# Patient Record
Sex: Male | Born: 1975 | Race: Black or African American | Hispanic: No | Marital: Married | Smoking: Former smoker
Health system: Southern US, Community
[De-identification: ages and names within clinical notes are randomized; demographics above are authoritative.]

## PROBLEM LIST (undated history)

## (undated) DIAGNOSIS — E7800 Pure hypercholesterolemia, unspecified: Secondary | ICD-10-CM

## (undated) DIAGNOSIS — R55 Syncope and collapse: Secondary | ICD-10-CM

## (undated) DIAGNOSIS — Z8774 Personal history of (corrected) congenital malformations of heart and circulatory system: Secondary | ICD-10-CM

## (undated) DIAGNOSIS — I442 Atrioventricular block, complete: Secondary | ICD-10-CM

## (undated) DIAGNOSIS — Q246 Congenital heart block: Secondary | ICD-10-CM

## (undated) DIAGNOSIS — I441 Atrioventricular block, second degree: Secondary | ICD-10-CM

## (undated) HISTORY — DX: Atrioventricular block, second degree: I44.1

## (undated) HISTORY — DX: Atrioventricular block, complete: I44.2

## (undated) HISTORY — DX: Congenital heart block: Q24.6

## (undated) HISTORY — DX: Syncope and collapse: R55

---

## 2004-10-13 ENCOUNTER — Emergency Department (HOSPITAL_COMMUNITY): Admission: EM | Admit: 2004-10-13 | Discharge: 2004-10-13 | Payer: Self-pay | Admitting: Emergency Medicine

## 2008-05-17 ENCOUNTER — Emergency Department (HOSPITAL_COMMUNITY): Admission: EM | Admit: 2008-05-17 | Discharge: 2008-05-17 | Payer: Self-pay | Admitting: Emergency Medicine

## 2008-09-22 ENCOUNTER — Emergency Department (HOSPITAL_COMMUNITY): Admission: EM | Admit: 2008-09-22 | Discharge: 2008-09-23 | Payer: Self-pay | Admitting: Emergency Medicine

## 2013-10-17 ENCOUNTER — Emergency Department (HOSPITAL_COMMUNITY): Payer: Medicaid Other

## 2013-10-17 ENCOUNTER — Encounter (HOSPITAL_COMMUNITY): Payer: Self-pay | Admitting: Emergency Medicine

## 2013-10-17 ENCOUNTER — Inpatient Hospital Stay (HOSPITAL_COMMUNITY)
Admission: EM | Admit: 2013-10-17 | Discharge: 2013-10-20 | DRG: 243 | Disposition: A | Discharge status: Home / Self Care | Payer: Medicaid Other | Attending: Cardiovascular Disease | Admitting: Cardiovascular Disease

## 2013-10-17 DIAGNOSIS — I442 Atrioventricular block, complete: Secondary | ICD-10-CM | POA: Diagnosis present

## 2013-10-17 DIAGNOSIS — Q246 Congenital heart block: Principal | ICD-10-CM

## 2013-10-17 DIAGNOSIS — R0989 Other specified symptoms and signs involving the circulatory and respiratory systems: Secondary | ICD-10-CM

## 2013-10-17 DIAGNOSIS — I441 Atrioventricular block, second degree: Secondary | ICD-10-CM

## 2013-10-17 DIAGNOSIS — R55 Syncope and collapse: Secondary | ICD-10-CM

## 2013-10-17 DIAGNOSIS — F17200 Nicotine dependence, unspecified, uncomplicated: Secondary | ICD-10-CM | POA: Diagnosis present

## 2013-10-17 HISTORY — DX: Personal history of (corrected) congenital malformations of heart and circulatory system: Z87.74

## 2013-10-17 LAB — CBC WITH DIFFERENTIAL/PLATELET
Basophils Absolute: 0 K/uL (ref 0.0–0.1)
Basophils Relative: 0 % (ref 0–1)
Eosinophils Absolute: 0 10*3/uL (ref 0.0–0.7)
Eosinophils Relative: 1 % (ref 0–5)
HCT: 43.6 % (ref 39.0–52.0)
Hemoglobin: 15.6 g/dL (ref 13.0–17.0)
Lymphocytes Relative: 57 % — ABNORMAL HIGH (ref 12–46)
Lymphs Abs: 3.8 10*3/uL (ref 0.7–4.0)
MCH: 31.1 pg (ref 26.0–34.0)
MCHC: 35.8 g/dL (ref 30.0–36.0)
MCV: 87 fL (ref 78.0–100.0)
Monocytes Absolute: 0.5 10*3/uL (ref 0.1–1.0)
Monocytes Relative: 7 % (ref 3–12)
Neutro Abs: 2.3 10*3/uL (ref 1.7–7.7)
Neutrophils Relative %: 35 % — ABNORMAL LOW (ref 43–77)
Platelets: 210 10*3/uL (ref 150–400)
RBC: 5.01 MIL/uL (ref 4.22–5.81)
RDW: 12.5 % (ref 11.5–15.5)
WBC: 6.7 10*3/uL (ref 4.0–10.5)

## 2013-10-17 NOTE — ED Provider Notes (Signed)
CSN: [id number]     Arrival date & time 10/17/13  2232 History   First MD Initiated Contact with Patient 10/17/13 2256     Chief Complaint  Patient presents with  . Near Syncope   (Consider location/radiation/quality/duration/timing/severity/associated sxs/prior Treatment) HPI Comments: Pt reports no new medications, no drug use.  Priro work up was with pediatric cardiology in [place] years ago.  Patient is a 37 y.o. male presenting with syncope. The history is provided by the patient and a relative.  Loss of Consciousness Episode history:  Single Most recent episode:  Today Duration:  1 minute Timing:  Constant Progression:  Partially resolved Chronicity:  New Context: standing up   Witnessed: yes   Relieved by:  Nothing Worsened by:  Nothing tried Ineffective treatments:  None tried Associated symptoms: chest pain, dizziness and weakness   Associated symptoms: no nausea, no seizures, no shortness of breath and no vomiting   Chest pain:    Quality:  Sharp and pressure   Severity:  Mild   Onset quality:  Sudden   Progression:  Resolved   Chronicity:  New Risk factors: congenital heart disease     Past Medical History  Diagnosis Date  . H/O congenital heart block    History reviewed. No pertinent past surgical history. History reviewed. No pertinent family history. History  Substance Use Topics  . Smoking status: Former Games developer  . Smokeless tobacco: Not on file  . Alcohol Use: Yes    Review of Systems  Respiratory: Positive for chest tightness. Negative for shortness of breath.   Cardiovascular: Positive for chest pain and syncope.  Gastrointestinal: Negative for nausea, vomiting, abdominal pain and diarrhea.  Musculoskeletal: Negative for back pain.  Neurological: Positive for dizziness, syncope and weakness. Negative for seizures.  All other systems reviewed and are negative.    Allergies  Review of patient's allergies indicates no known allergies.  Home  Medications   Current Outpatient Rx  Name  Route  Sig  Dispense  Refill  . ibuprofen (ADVIL,MOTRIN) 800 MG tablet   Oral   Take 800 mg by mouth every 8 (eight) hours as needed for moderate pain.          BP 141/45  Pulse 39  Temp(Src) 97.8 F (36.6 C) (Oral)  Resp 18  SpO2 98% Physical Exam  Nursing note and vitals reviewed. Constitutional: He is oriented to person, place, and time. He appears well-developed and well-nourished. No distress.  Eyes: Conjunctivae and EOM are normal. No scleral icterus.  Neck: Normal range of motion. Neck supple.  Cardiovascular: Intact distal pulses.  An irregular rhythm present. Bradycardia present.   Pulmonary/Chest: Effort normal. No respiratory distress. He has no wheezes.  Abdominal: Soft. There is no tenderness. There is no rebound.  Neurological: He is alert and oriented to person, place, and time. He exhibits normal muscle tone. Coordination normal.  Skin: Skin is warm and dry. He is not diaphoretic.    ED Course  Procedures (including critical care time)  CRITICAL CARE Performed by: Lear Ng. Total critical care time: 30 min Critical care time was exclusive of separately billable procedures and treating other patients. Critical care was necessary to treat or prevent imminent or life-threatening deterioration. Critical care was time spent personally by me on the following activities: development of treatment plan with patient and/or surrogate as well as nursing, discussions with consultants, evaluation of patient's response to treatment, examination of patient, obtaining history from patient or surrogate, ordering and performing treatments  and interventions, ordering and review of laboratory studies, ordering and review of radiographic studies, pulse oximetry and re-evaluation of patient's condition.   Labs Review Labs Reviewed  CBC WITH DIFFERENTIAL  BASIC METABOLIC PANEL  APTT  PROTIME-INR   Imaging Review No results  found.  EKG Interpretation     Ventricular Rate:  36 PR Interval:  479 QRS Duration: 91 QT Interval:  523 QTC Calculation: 405 R Axis:   57 Text Interpretation:  Second degree heart block Prolonged PR interval Right atrial enlargement Abnormal ECG No previous tracing           ra sat is 98% and I interpret to be normal  11:24 PM Pt remains stable with HR variable in the 30-50 range.  Pacer pads nearby.  Labs and [facility] ordered. Discussed with cardiology who agrees to accept to [facility] step down, under observation.    Review of rhythm strips shows second degree block.      MDM   1. Second degree heart block by electrocardiogram (ECG)   2. Syncope      Pt with symptomatic episodes of bradycardia, ECG suggests buried P wave indicating 2nd degree heart block, unable to tell if type 1 or type 2.  Will likely need a pacemaker.  Will consult with cardiology and likely transfer to [facility] ED.      Gavin Pound. Oletta Lamas, MD 10/17/13 2332

## 2013-10-17 NOTE — ED Notes (Signed)
Pt arrived to Ed with a complaint of near syncope.  Pt has a hx of congential heart defect.  Pt is bradycardic in triage.  Pt states his head hurts.  Pt dyed his hair and then felt faint.  Pt states at that time he had chest pain.

## 2013-10-17 NOTE — ED Notes (Signed)
Pt placed on Zoll monitor per Dr Harmon Pier request; pacer pads placed per Dr Oletta Lamas.

## 2013-10-18 ENCOUNTER — Encounter (HOSPITAL_COMMUNITY): Payer: Self-pay | Admitting: Internal Medicine

## 2013-10-18 DIAGNOSIS — R55 Syncope and collapse: Secondary | ICD-10-CM

## 2013-10-18 DIAGNOSIS — I059 Rheumatic mitral valve disease, unspecified: Secondary | ICD-10-CM

## 2013-10-18 DIAGNOSIS — I441 Atrioventricular block, second degree: Secondary | ICD-10-CM

## 2013-10-18 DIAGNOSIS — Q246 Congenital heart block: Principal | ICD-10-CM

## 2013-10-18 DIAGNOSIS — I442 Atrioventricular block, complete: Secondary | ICD-10-CM | POA: Diagnosis present

## 2013-10-18 DIAGNOSIS — J811 Chronic pulmonary edema: Secondary | ICD-10-CM

## 2013-10-18 LAB — COMPREHENSIVE METABOLIC PANEL
ALT: 23 U/L (ref 0–53)
Albumin: 3.9 g/dL (ref 3.5–5.2)
BUN: 9 mg/dL (ref 6–23)
CO2: 25 mEq/L (ref 19–32)
Calcium: 9.2 mg/dL (ref 8.4–10.5)
Creatinine, Ser: 0.8 mg/dL (ref 0.50–1.35)
GFR calc Af Amer: >90 mL/min (ref >90)
GFR calc non Af Amer: >90 mL/min (ref >90)
Glucose, Bld: 121 mg/dL — ABNORMAL HIGH (ref 70–99)
Sodium: 138 mEq/L (ref 135–145)
Total Protein: 7.5 g/dL (ref 6.0–8.3)

## 2013-10-18 LAB — APTT
aPTT: 33 seconds (ref 24–37)
aPTT: 38 seconds — ABNORMAL HIGH (ref 24–37)

## 2013-10-18 LAB — TSH: TSH: 0.356 u[IU]/mL (ref 0.350–4.500)

## 2013-10-18 LAB — BASIC METABOLIC PANEL
BUN: 11 mg/dL (ref 6–23)
CO2: 30 mEq/L (ref 19–32)
Calcium: 9.8 mg/dL (ref 8.4–10.5)
Chloride: 99 mEq/L (ref 96–112)
Creatinine, Ser: 0.86 mg/dL (ref 0.50–1.35)
GFR calc Af Amer: >90 mL/min (ref >90)
GFR calc non Af Amer: >90 mL/min (ref >90)
Glucose, Bld: 105 mg/dL — ABNORMAL HIGH (ref 70–99)
Potassium: 3.7 mEq/L (ref 3.5–5.1)
Sodium: 137 mEq/L (ref 135–145)

## 2013-10-18 LAB — PROTIME-INR
INR: 1.04 (ref 0.00–1.49)
INR: 1.06 (ref 0.00–1.49)
Prothrombin Time: 13.4 seconds (ref 11.6–15.2)
Prothrombin Time: 13.6 seconds (ref 11.6–15.2)

## 2013-10-18 MED ORDER — ACETAMINOPHEN 325 MG PO TABS
650.0000 mg | ORAL_TABLET | Freq: Four times a day (QID) | ORAL | Status: DC | PRN
  Administered 2013-10-18 – 2013-10-20 (×3): 650 mg via ORAL
  Filled 2013-10-18 (×3): qty 2

## 2013-10-18 MED ORDER — SODIUM CHLORIDE 0.9 % IV SOLN
INTRAVENOUS | Status: AC
  Administered 2013-10-18: 03:00:00 via INTRAVENOUS

## 2013-10-18 MED ORDER — ACETAMINOPHEN 650 MG RE SUPP: 650.0000 mg | Freq: Four times a day (QID) | RECTAL | Status: DC | PRN

## 2013-10-18 MED ORDER — SODIUM CHLORIDE 0.9 % IJ SOLN
3.0000 mL | Freq: Two times a day (BID) | INTRAMUSCULAR | Status: DC
  Administered 2013-10-18 – 2013-10-19 (×4): 3 mL via INTRAVENOUS

## 2013-10-18 NOTE — Progress Notes (Signed)
    Subjective:  Pt presented overnight after a syncopal episode while dying his hair and was found to be in 2nd degree AV block, type II. He denies any chest pain, SOB, abdominal pain, N/V, and states that he feels like his "normal self."   Objective:  Vital Signs in the last 24 hours: Temp:  [97.7 F (36.5 C)-98.5 F (36.9 C)] 98.5 F (36.9 C) (11/09 0443) Pulse Rate:  [32-39] 32 (11/09 0800) Resp:  [13-22] 15 (11/09 0800) BP: (104-141)/(33-62) 112/43 mmHg (11/09 0800) SpO2:  [98 %-100 %] 98 % (11/09 0800) Weight:  [194 lb 14.2 oz (88.4 kg)] 194 lb 14.2 oz (88.4 kg) (11/09 0220)  Intake/Output from previous day: 11/08 0701 - 11/09 0700 In: 262.5 [I.V.:262.5] Out: -   Physical Exam: Pt is very pleasant male, alert and oriented, NAD HEENT: NCAT Neck: JVP - normal, carotids 2+= without bruits Lungs: CTA bilaterally CV: Regular without murmur or gallop Abd: soft, NT, Positive BS, no hepatomegaly Ext: no C/C/E, distal pulses intact and equal Neuro: CN 2-12 intact, nonfocal, moves all 4 extremities, sensation intact Skin: warm/dry no rash   Lab Results:  Recent Labs  10/17/13 2310  WBC 6.7  HGB 15.6  PLT 210    Recent Labs  10/17/13 2310 10/18/13 0510  NA 137 138  K 3.7 3.8  CL 99 102  CO2 30 25  GLUCOSE 105* 121*  BUN 11 9  CREATININE 0.86 0.80   No results found for this basename: TROPONINI, CK, MB,  in the last 72 hours  Cardiac Studies: 2D ECHO pending  Tele: 2:1 AV block, Type II, rate 60.  Assessment/Plan:  37yo M with PMH of congenital heart block who presented tot he ED early in the morning of 11/9 with syncope, and in the ED telemetry revealed HR 30-80s with a 2:1 AV Block/Mobitz II.  1. 2:1 AV block/Mobitz II: Pt with a h/o of congenital heart block. He denise any h/o of CP or previous syncopal episodes, but given his medial history, the AV block could be the patient's baseline rhythm, and the strong smell from dying his hair could have  triggered the syncope. CMP, CBC, and pro BNP all within normal limits. Troponins have been ordered and TSH is pending. Will check Mg levels as well. ECHO has been ordered for today to evaluate for structural disease.    Genelle Gather, MD Internal Medicine Resident, PGY II [facility] Internal Medicine Program 10/18/2013 10:15 AM

## 2013-10-18 NOTE — Progress Notes (Signed)
The patient was seen and examined, and I agree with the assessment and plan as documented above, with modifications as noted below. 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 Echocardiogram is about to be performed. I have encouraged him to obtain routine cardiac follow-up, and he has agreed to this. He had some mild pulmonary vascular congestion on exam, but his lungs are clear to auscultation at present and he is asymptomatic, with a normal pro-BNP. Will f/u echo results. I agree that consideration of a treadmill test to assess chronotropic response, and to see if he can walk around in the room or hall with assistance may be the best course of action, hopefully avoiding pacemaker placement, as his syncopal event appears to have been driven by a vasovagal response from the noxious scent of the hair dye.

## 2013-10-18 NOTE — Progress Notes (Signed)
Utilization Review completed.  

## 2013-10-18 NOTE — H&P (Signed)
RAINEY KAHRS is an 37 y.o. male.   Chief Complaint: Syncope HPI: 37 yo man with PMH of congenital heart block who comes in with syncope. He tells me he hasn't seen a pediatric cardiologist since he was 16 or 17. He believes he followed at [facility]. He's lived in [place] since age 86. 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 He tells me he is always on the go. He's never passed out before and never had chest pain. No recent illness. He was dying some gray hairs at the sink and remembers a very potent, powerful smell and the next thing he knows he was on the floor. This was witnessed by his girlfriend who is returning to [facility] but not there on my history and physical. He said he did not have forewarning otherwise but did also note some nausea briefly. In the ER his EKG was notable for HR 30s-80s with 2:1 AV block intermittently and Cardiology was consulted.    Past Medical History  Diagnosis Date  . H/O congenital heart block     History reviewed. No pertinent past surgical history.  History reviewed. No pertinent family history. Social History:  reports that he has quit smoking. He does not have any smokeless tobacco history on file. He reports that he drinks alcohol. He reports that he does not use illicit drugs. No family history of CAD or heart block  Allergies: No Known Allergies   (Not in a hospital admission)  Results for orders placed during the hospital encounter of 10/17/13 (from the past 48 hour(s))  CBC WITH DIFFERENTIAL     Status: Abnormal   Collection Time    10/17/13 11:10 PM      Result Value Range   WBC 6.7  4.0 - 10.5 K/uL   RBC 5.01  4.22 - 5.81 MIL/uL   Hemoglobin 15.6  13.0 - 17.0 g/dL   HCT 96.0  45.4 - 09.8 %   MCV 87.0  78.0 - 100.0 fL   MCH 31.1  26.0 - 34.0 pg   MCHC 35.8  30.0 - 36.0 g/dL   RDW 11.9  14.7 - 82.9 %   Platelets 210  150 - 400 K/uL   Neutrophils Relative % 35 (*) 43 - 77 %   Neutro Abs 2.3  1.7  - 7.7 K/uL   Lymphocytes Relative 57 (*) 12 - 46 %   Lymphs Abs 3.8  0.7 - 4.0 K/uL   Monocytes Relative 7  3 - 12 %   Monocytes Absolute 0.5  0.1 - 1.0 K/uL   Eosinophils Relative 1  0 - 5 %   Eosinophils Absolute 0.0  0.0 - 0.7 K/uL   Basophils Relative 0  0 - 1 %   Basophils Absolute 0.0  0.0 - 0.1 K/uL  BASIC METABOLIC PANEL     Status: Abnormal   Collection Time    10/17/13 11:10 PM      Result Value Range   Sodium 137  135 - 145 mEq/L   Potassium 3.7  3.5 - 5.1 mEq/L   Chloride 99  96 - 112 mEq/L   CO2 30  19 - 32 mEq/L   Glucose, Bld 105 (*) 70 - 99 mg/dL   BUN 11  6 - 23 mg/dL   Creatinine, Ser 5.62  0.50 - 1.35 mg/dL   Calcium 9.8  8.4 - 13.0 mg/dL   GFR calc non Af Amer >90  >90 mL/min  GFR calc Af Amer >90  >90 mL/min   Comment: (NOTE)     The eGFR has been calculated using the CKD EPI equation.     This calculation has not been validated in all clinical situations.     eGFR's persistently <90 mL/min signify possible Chronic Kidney     Disease.  APTT     Status: None   Collection Time    10/17/13 11:10 PM      Result Value Range   aPTT 33  24 - 37 seconds  PROTIME-INR     Status: None   Collection Time    10/17/13 11:10 PM      Result Value Range   Prothrombin Time 13.4  11.6 - 15.2 seconds   INR 1.04  0.00 - 1.49   Dg Chest Port 1 View  10/17/2013   CLINICAL DATA:  Chest pain  EXAM: PORTABLE CHEST - 1 VIEW  COMPARISON:  None available  FINDINGS: Defibrillator pads overlie the left hemi thorax. Transverse heart size is within normal limits.  Lungs are normally inflated. There is mild indistinctness of the pulmonary vasculature diffusely without pulmonary edema. No focal infiltrate. No pneumothorax.  Osseous structures are within normal limits.  IMPRESSION: Mild pulmonary vascular congestion without overt pulmonary edema or focal infiltrate.   Electronically Signed   By: Rise Mu M.D.   On: 10/17/2013 23:34    Review of Systems  Constitutional:  Negative for fever, chills and weight loss.  HENT: Negative for hearing loss and tinnitus.   Eyes: Negative for blurred vision, double vision and photophobia.  Respiratory: Negative for cough, hemoptysis and sputum production.   Cardiovascular: Negative for chest pain, palpitations and orthopnea.  Gastrointestinal: Negative for heartburn, nausea and vomiting.  Genitourinary: Negative for dysuria, urgency and frequency.  Musculoskeletal: Negative for myalgias and neck pain.  Skin: Negative for itching and rash.  Neurological: Positive for loss of consciousness. Negative for dizziness, tingling, tremors and headaches.  Endo/Heme/Allergies: Negative for environmental allergies. Does not bruise/bleed easily.  Psychiatric/Behavioral: Negative for depression and suicidal ideas.    Blood pressure 141/45, pulse 39, temperature 97.8 F (36.6 C), temperature source Oral, resp. rate 18, SpO2 98.00%. Physical Exam  Nursing note and vitals reviewed. Constitutional: He is oriented to person, place, and time. He appears well-developed and well-nourished. No distress.  HENT:  Head: Normocephalic and atraumatic.  Nose: Nose normal.  Mouth/Throat: Oropharynx is clear and moist. No oropharyngeal exudate.  Eyes: Conjunctivae and EOM are normal. Pupils are equal, round, and reactive to light. No scleral icterus.  Neck: Normal range of motion. Neck supple. No JVD present. No tracheal deviation present. No thyromegaly present.  Cardiovascular: Normal heart sounds and intact distal pulses.  Exam reveals no gallop.   No murmur heard. Fairly regular but some irregularity  Respiratory: Effort normal and breath sounds normal. No respiratory distress. He has no wheezes. He has no rales.  GI: Soft. Bowel sounds are normal. He exhibits no distension. There is no tenderness.  Musculoskeletal: Normal range of motion. He exhibits no edema and no tenderness.  Neurological: He is alert and oriented to person, place, and  time. No cranial nerve deficit. Coordination normal.  Skin: Skin is warm and dry. No rash noted. He is not diaphoretic. No erythema.  Psychiatric: He has a normal mood and affect. His behavior is normal. Judgment and thought content normal.    Labs reviewed; wbc 6.7, h/h 15.6/43.6, plt 210, na 137, K 3.7, bun/cr 11/0.86, cl 99,  co2 30 Chest x-ray: no acute process EKG: HR 36, nl axis, RAE, 2:1 AV Block  Problem List Syncope 2:1 AV Block (Mobitz Type II) Congenital Heart Block Occasional Black and Mild tobacco Assessment/Plan 36 yo man with PMH of congenital heart block who comes in with syncope and found to have 2:1 AV Block/Mobitz II. In the setting of syncope in a patient without a history of congenital heat block, this warrants a PPM unless other driving event. However, this may be his underlying rhythm he's tolerated well with appropriate chronotropic response and this syncopal episode was triggered by the smell of the hair dye. Will seek out other etiologies, evaluate for ischemia, electrolytes, thyroid disease, other structural heart disease among other etiologies while on stepdown/telemetry. Will try to obtain records in AM but may be past window (15-16 years ago or more). Consideration of treadmill test to assess chronotropic response if he can walk around in room or hall with assistance may be best course of action. Will also obtain echo to evaluate for any structural disease. I discussed my thoughts with Mr. Boehlke who understood and was agreeable.  - admit to stepdown with telemetry - BNP, Mg, TSH - No AV nodal blocking agents - echocardiogram in AM - smoking cessation counseling but he's already planning on quitting as he smokes infrequently now  Shmiel Morton 10/18/2013, 12:11 AM

## 2013-10-18 NOTE — Progress Notes (Signed)
  Echocardiogram 2D Echocardiogram has been performed.  Jonathan Logan 10/18/2013, 11:12 AM

## 2013-10-19 ENCOUNTER — Encounter (HOSPITAL_COMMUNITY)
Admission: EM | Disposition: A | Discharge status: Home / Self Care | Payer: Self-pay | Source: Home / Self Care | Attending: Cardiovascular Disease

## 2013-10-19 ENCOUNTER — Inpatient Hospital Stay (HOSPITAL_COMMUNITY): Payer: Medicaid Other

## 2013-10-19 DIAGNOSIS — I442 Atrioventricular block, complete: Secondary | ICD-10-CM

## 2013-10-19 HISTORY — PX: PERMANENT PACEMAKER INSERTION: SHX5480

## 2013-10-19 SURGERY — PERMANENT PACEMAKER INSERTION
Anesthesia: LOCAL

## 2013-10-19 MED ORDER — LIDOCAINE HCL (PF) 1 % IJ SOLN
INTRAMUSCULAR | Status: AC
  Filled 2013-10-19: qty 60

## 2013-10-19 MED ORDER — FENTANYL CITRATE 0.05 MG/ML IJ SOLN
INTRAMUSCULAR | Status: AC
  Filled 2013-10-19: qty 2

## 2013-10-19 MED ORDER — CEFAZOLIN SODIUM-DEXTROSE 2-3 GM-% IV SOLR
2.0000 g | Freq: Four times a day (QID) | INTRAVENOUS | Status: AC
  Administered 2013-10-19 – 2013-10-20 (×3): 2 g via INTRAVENOUS
  Filled 2013-10-19 (×3): qty 50

## 2013-10-19 MED ORDER — ACETAMINOPHEN 325 MG PO TABS: 325.0000 mg | ORAL_TABLET | ORAL | Status: DC | PRN

## 2013-10-19 MED ORDER — SODIUM CHLORIDE 0.9 % IV SOLN: 250.0000 mL | INTRAVENOUS | Status: DC

## 2013-10-19 MED ORDER — SODIUM CHLORIDE 0.9 % IR SOLN
80.0000 mg | Status: DC
  Filled 2013-10-19: qty 2

## 2013-10-19 MED ORDER — HEPARIN (PORCINE) IN NACL 2-0.9 UNIT/ML-% IJ SOLN
INTRAMUSCULAR | Status: AC
  Filled 2013-10-19: qty 500

## 2013-10-19 MED ORDER — YOU HAVE A PACEMAKER BOOK
Freq: Once | Status: AC
  Administered 2013-10-19: 22:00:00
  Filled 2013-10-19: qty 1

## 2013-10-19 MED ORDER — CEFAZOLIN SODIUM-DEXTROSE 2-3 GM-% IV SOLR
2.0000 g | INTRAVENOUS | Status: DC
  Filled 2013-10-19 (×3): qty 50

## 2013-10-19 MED ORDER — SODIUM CHLORIDE 0.9 % IJ SOLN
3.0000 mL | Freq: Two times a day (BID) | INTRAMUSCULAR | Status: DC
  Administered 2013-10-19: 3 mL via INTRAVENOUS

## 2013-10-19 MED ORDER — SODIUM CHLORIDE 0.9 % IJ SOLN: 3.0000 mL | INTRAMUSCULAR | Status: DC | PRN

## 2013-10-19 MED ORDER — HYDROCODONE-ACETAMINOPHEN 5-325 MG PO TABS
1.0000 | ORAL_TABLET | ORAL | Status: DC | PRN
  Administered 2013-10-19 – 2013-10-20 (×3): 1 via ORAL
  Filled 2013-10-19 (×3): qty 1

## 2013-10-19 MED ORDER — MIDAZOLAM HCL 5 MG/5ML IJ SOLN
INTRAMUSCULAR | Status: AC
  Filled 2013-10-19: qty 5

## 2013-10-19 MED ORDER — SODIUM CHLORIDE 0.9 % IV SOLN
INTRAVENOUS | Status: DC
  Administered 2013-10-19: 1000 mL via INTRAVENOUS

## 2013-10-19 MED ORDER — ONDANSETRON HCL 4 MG/2ML IJ SOLN: 4.0000 mg | Freq: Four times a day (QID) | INTRAMUSCULAR | Status: DC | PRN

## 2013-10-19 NOTE — Consult Note (Signed)
  Reason for Consult:CHB and syncope  Referring Physician: Dr. [name] is an 37 y.o. male.   HPI: The patient is a 37 yo man with congenital CHB, diagnosed at age 37. He has done well until presenting with syncope after smelling noxious chemicals while he was getting his hair dyed. He was taken to the hospital where he was found to have CHB with a junctional escape/alternating with a ventricular escape. He has never had syncope before and denies chest pain or sob. No edema or CHF symptoms.  PMH: Past Medical History  Diagnosis Date  . H/O congenital heart block     PSHX:History reviewed. No pertinent past surgical history.  FAMHX:History reviewed. No pertinent family history.  Social History:  reports that he has quit smoking. He does not have any smokeless tobacco history on file. He reports that he drinks alcohol. He reports that he does not use illicit drugs.  Allergies: No Known Allergies  Medications: reviewed  Dg Chest 2 View  10/19/2013   CLINICAL DATA:  Chest pain  EXAM: CHEST  2 VIEW  COMPARISON:  10/17/2013  FINDINGS: The heart size and mediastinal contours are within normal limits. Both lungs are clear. The visualized skeletal structures are unremarkable.  IMPRESSION: No active cardiopulmonary disease.   Electronically Signed   By: Jonathan Logan M.D.   On: 10/19/2013 10:03   Dg Chest Port 1 View  10/17/2013   CLINICAL DATA:  Chest pain  EXAM: PORTABLE CHEST - 1 VIEW  COMPARISON:  None available  FINDINGS: Defibrillator pads overlie the left hemi thorax. Transverse heart size is within normal limits.  Lungs are normally inflated. There is mild indistinctness of the pulmonary vasculature diffusely without pulmonary edema. No focal infiltrate. No pneumothorax.  Osseous structures are within normal limits.  IMPRESSION: Mild pulmonary vascular congestion without overt pulmonary edema or focal infiltrate.   Electronically Signed   By: Jonathan Logan M.D.    On: 10/17/2013 23:34    ROS  As stated in the HPI and negative for all other systems.  Physical Exam  Vitals:Blood pressure 148/79, pulse 40, temperature 98.8 F (37.1 C), temperature source Oral, resp. rate 14, height 5\' 8"  (1.727 m), weight 194 lb 14.2 oz (88.4 kg), SpO2 98.00%.  Well appearing young man, NAD HEENT: Unremarkable Neck:  No JVD, no thyromegally Back:  No CVA tenderness Lungs:  Clear with no wheezes HEART:  Regular brady rhythm, no murmurs, no rubs, no clicks Abd:  soft, positive bowel sounds, no organomegally, no rebound, no guarding Ext:  2 plus pulses, no edema, no cyanosis, no clubbing Skin:  No rashes no nodules Neuro:  CN II through XII intact, motor grossly intact  Assessment/Plan: 1. CHB 2. Syncope Rec: I have discussed the situation with the patient and his family. The risks/benefits/goals/expectations of PPM have been discussed with the patient and he wishes to proceed.  Jonathan Gowda TaylorMD 10/19/2013, 1:22 PM

## 2013-10-19 NOTE — Plan of Care (Signed)
Problem: Phase II Progression Outcomes Goal: Progress activity as tolerated unless otherwise ordered Outcome: Completed/Met Date Met:  10/19/13 Pt ambulated in hall during am

## 2013-10-19 NOTE — H&P (View-Only) (Signed)
  Reason for Consult:CHB and syncope  Referring Physician: Dr. Koneswaren  Jonathan Logan is an 37 y.o. male.   HPI: The patient is a 37 yo man with congenital CHB, diagnosed at age 6. He has done well until presenting with syncope after smelling noxious chemicals while he was getting his hair dyed. He was taken to the hospital where he was found to have CHB with a junctional escape/alternating with a ventricular escape. He has never had syncope before and denies chest pain or sob. No edema or CHF symptoms.  PMH: Past Medical History  Diagnosis Date  . H/O congenital heart block     PSHX:History reviewed. No pertinent past surgical history.  FAMHX:History reviewed. No pertinent family history.  Social History:  reports that he has quit smoking. He does not have any smokeless tobacco history on file. He reports that he drinks alcohol. He reports that he does not use illicit drugs.  Allergies: No Known Allergies  Medications: reviewed  Dg Chest 2 View  10/19/2013   CLINICAL DATA:  Chest pain  EXAM: CHEST  2 VIEW  COMPARISON:  10/17/2013  FINDINGS: The heart size and mediastinal contours are within normal limits. Both lungs are clear. The visualized skeletal structures are unremarkable.  IMPRESSION: No active cardiopulmonary disease.   Electronically Signed   By: David  Ormond M.D.   On: 10/19/2013 10:03   Dg Chest Port 1 View  10/17/2013   CLINICAL DATA:  Chest pain  EXAM: PORTABLE CHEST - 1 VIEW  COMPARISON:  None available  FINDINGS: Defibrillator pads overlie the left hemi thorax. Transverse heart size is within normal limits.  Lungs are normally inflated. There is mild indistinctness of the pulmonary vasculature diffusely without pulmonary edema. No focal infiltrate. No pneumothorax.  Osseous structures are within normal limits.  IMPRESSION: Mild pulmonary vascular congestion without overt pulmonary edema or focal infiltrate.   Electronically Signed   By: Benjamin  McClintock M.D.    On: 10/17/2013 23:34    ROS  As stated in the HPI and negative for all other systems.  Physical Exam  Vitals:Blood pressure 148/79, pulse 40, temperature 98.8 F (37.1 C), temperature source Oral, resp. rate 14, height 5' 8" (1.727 m), weight 194 lb 14.2 oz (88.4 kg), SpO2 98.00%.  Well appearing young man, NAD HEENT: Unremarkable Neck:  No JVD, no thyromegally Back:  No CVA tenderness Lungs:  Clear with no wheezes HEART:  Regular brady rhythm, no murmurs, no rubs, no clicks Abd:  soft, positive bowel sounds, no organomegally, no rebound, no guarding Ext:  2 plus pulses, no edema, no cyanosis, no clubbing Skin:  No rashes no nodules Neuro:  CN II through XII intact, motor grossly intact  Assessment/Plan: 1. CHB 2. Syncope Rec: I have discussed the situation with the patient and his family. The risks/benefits/goals/expectations of PPM have been discussed with the patient and he wishes to proceed.  Gregg TaylorMD 10/19/2013, 1:22 PM      

## 2013-10-19 NOTE — Progress Notes (Signed)
Pt was ambulated on unit per order and was found to have HR of 45-49 while ambulating, compared to HR of 39 while resting.  Per monitor EKG, pt was found to have slight ST elevation, strips have been saved and MD has been made aware.  Nurse will continue to monitor

## 2013-10-19 NOTE — Progress Notes (Signed)
Pt viewed pacemaker video

## 2013-10-19 NOTE — CV Procedure (Signed)
DDD PPM insertion via the left subclavian vein without immediate complication. [id number].

## 2013-10-19 NOTE — Progress Notes (Addendum)
Patient Name: KAZUO DURNIL Date of Encounter: 10/19/2013  Principal Problem:   Syncope Active Problems:   Mobitz type 2 second degree AV block   Congenital heart block    SUBJECTIVE: Pt feels fine, HR increases with minimal activity. He does not routinely experience presyncope. Does not remember being told of a heart murmur as a child, was always seen for heart rhythm problem.  OBJECTIVE Filed Vitals:   10/19/13 0030 10/19/13 0100 10/19/13 0400 10/19/13 0500  BP:  101/68  94/60  Pulse: 80  97   Temp: 97.8 F (36.6 C)  97.7 F (36.5 C)   TempSrc: Oral  Oral   Resp: 10  23 17   Height:      Weight:      SpO2: 100%   98%    Intake/Output Summary (Last 24 hours) at 10/19/13 0641 Last data filed at 10/18/13 2220  Gross per 24 hour  Intake    300 ml  Output    350 ml  Net    -50 ml   Filed Weights   10/18/13 0220  Weight: 194 lb 14.2 oz (88.4 kg)    PHYSICAL EXAM General: Well developed, well nourished, male in no acute distress. Head: Normocephalic, atraumatic.  Neck: Supple without bruits, JVD not elevated. Lungs:  Resp regular and unlabored, CTA bilaterally. Heart: RRR, S1, S2, no S3, S4, 2-3 murmur; no rub. Abdomen: Soft, non-tender, non-distended, BS + x 4.  Extremities: No clubbing, cyanosis, no edema.  Neuro: Alert and oriented X 3. Moves all extremities spontaneously. Psych: Normal affect.  LABS: CBC: Recent Labs  10/17/13 2310  WBC 6.7  NEUTROABS 2.3  HGB 15.6  HCT 43.6  MCV 87.0  PLT 210   INR: Recent Labs  10/18/13 0510  INR 1.06   Basic Metabolic Panel: Recent Labs  10/17/13 2310 10/18/13 0510  NA 137 138  K 3.7 3.8  CL 99 102  CO2 30 25  GLUCOSE 105* 121*  BUN 11 9  CREATININE 0.86 0.80  CALCIUM 9.8 9.2  MG  --  1.9   Liver Function Tests: Recent Labs  10/18/13 0510  AST 20  ALT 23  ALKPHOS 83  BILITOT 0.4  PROT 7.5  ALBUMIN 3.9   Cardiac Enzymes: Recent Labs  10/19/13 0340  TROPONINI <0.30    BNP: Pro B Natriuretic peptide (BNP)  Date/Time Value Range Status  10/18/2013  5:10 AM 19.2  0 - 125 pg/mL Final   Thyroid Function Tests: Recent Labs  10/18/13 0510  TSH 0.356   TELE:   CHB, HR low at times, without symptoms    ECG: MD review, believe he is in CHB, HR generally OK.   ECHO: - 10/18/2013 Conclusions:  Left ventricle: Wall thickness was increased in a pattern of mild LVH. Systolic function was normal. The estimated ejection fraction was in the range of 60% to 65%. Left ventricular diastolic function parameters were normal. - Mitral valve: Mild regurgitation.   Radiology/Studies: Dg Chest Port 1 View 10/17/2013   CLINICAL DATA:  Chest pain  EXAM: PORTABLE CHEST - 1 VIEW  COMPARISON:  None available  FINDINGS: Defibrillator pads overlie the left hemi thorax. Transverse heart size is within normal limits.  Lungs are normally inflated. There is mild indistinctness of the pulmonary vasculature diffusely without pulmonary edema. No focal infiltrate. No pneumothorax.  Osseous structures are within normal limits.  IMPRESSION: Mild pulmonary vascular congestion without overt pulmonary edema or focal infiltrate.   Electronically  Signed   By: Rise Mu M.D.   On: 10/17/2013 23:34     Current Medications:  . sodium chloride  3 mL Intravenous Q12H      ASSESSMENT AND PLAN: Principal Problem:   Syncope - telemetry reviewed. Believe he is in CHB. Per pt, this is chronic. Will ck free T4 since TSH borderline. MD review all data, will order ambulation and have staff document HR with ambulation. EF normal. Reviewed data w/ Dr. Ladona Ridgel, will need PPM, have put on for this pm.  Active Problems: 1. Syncope  2. Congenital complete heart block - see above.   Melida Quitter , PA-C 6:41 AM 10/19/2013  EP Attending  Patient seen and examined. See my consult note.  [name].D.

## 2013-10-19 NOTE — Interval H&P Note (Signed)
History and Physical Interval Note:  10/19/2013 2:58 PM  Jonathan Logan  has presented today for surgery, with the diagnosis of bradicardia  The various methods of treatment have been discussed with the patient and family. After consideration of risks, benefits and other options for treatment, the patient has consented to  Procedure(s): PERMANENT PACEMAKER INSERTION (N/A) as a surgical intervention .  The patient's history has been reviewed, patient examined, no change in status, stable for surgery.  I have reviewed the patient's chart and labs.  Questions were answered to the patient's satisfaction.     [name].D.

## 2013-10-20 ENCOUNTER — Inpatient Hospital Stay (HOSPITAL_COMMUNITY): Payer: Medicaid Other

## 2013-10-20 DIAGNOSIS — I441 Atrioventricular block, second degree: Secondary | ICD-10-CM

## 2013-10-20 NOTE — Discharge Summary (Signed)
     ELECTROPHYSIOLOGY PROCEDURE DISCHARGE SUMMARY    Patient ID: Jonathan Logan,  MRN: [id number], DOB/AGE: 1976-09-29 37 y.o.  Admit date: 10/17/2013 Discharge date: 10/20/2013  Primary Care Physician: none Primary Cardiologist: new to [facility] - will follow with Dr Ladona Ridgel  Primary Discharge Diagnosis:  Congenital complete heart block and syncope status post dual chamber pacemaker implantation this admission    Procedures This Admission:  1.  Implantation of a dual chamber pacemaker by Dr Ladona Ridgel on 10-20-2013.  The patient received a Medtronic pacemaker.  See op note for full details.  There were no early apparent complications. 2.  Echo on 10-18-2013 demonstrated an EF of 60-65% with mild MR 3.  CXR on 10-20-2013 demonstrated no pneumothorax status post device implantation  Brief HPI/Hospital Course: Jonathan Logan is a 37 year old male who was diagnosed with congenital complete heart block at age 69.  He has done well without functional limitations.  On the day of admission, he had a syncopal spell and presented to the ER for evaluation.  He was seen by Dr Ladona Ridgel who recommended pacemaker implantation.  Risks, benefits, and alternatives were reviewed with the patient who wished to proceed. The patient underwent implantation of a Medtronic dual chamber pacemaker with details as outlined above.   He was monitored on telemetry overnight which demonstrated sinus rhythm with ventricular pacing.  Left chest was without hematoma or ecchymosis.  The device was interrogated and found to be functioning normally.  CXR was obtained and demonstrated no pneumothorax status post device implantation.  Wound care, arm mobility, and restrictions were reviewed with the patient.  Dr Ladona Ridgel examined the patient and considered them stable for discharge to home.  He has been encouraged to follow up with a primary care physician. Routine follow-up has been scheduled with Dr Ladona Ridgel.    Discharge Vitals:  Blood pressure 141/81, pulse 73, temperature 98.1 F (36.7 C), temperature source Oral, resp. rate 20, height 5\' 8"  (1.727 m), weight 196 lb 3.4 oz (89 kg), SpO2 96.00%.    Labs:   Lab Results  Component Value Date   WBC 6.7 10/17/2013   HGB 15.6 10/17/2013   HCT 43.6 10/17/2013   MCV 87.0 10/17/2013   PLT 210 10/17/2013    Recent Labs Lab 10/18/13 0510  NA 138  K 3.8  CL 102  CO2 25  BUN 9  CREATININE 0.80  CALCIUM 9.2  PROT 7.5  BILITOT 0.4  ALKPHOS 83  ALT 23  AST 20  GLUCOSE 121*   Lab Results  Component Value Date   TROPONINI <0.30 10/19/2013     Discharge Medications:    Medication List    ASK your doctor about these medications       ibuprofen 800 MG tablet  Commonly known as:  ADVIL,MOTRIN  Take 800 mg by mouth every 8 (eight) hours as needed for moderate pain.        Disposition:       Future Appointments Provider Department Dept Phone   11/02/2013 12:00 PM [facility] [place] Office [phone]   01/22/2014 10:00 AM Marinus Maw, MD [facility] Office [phone]      Duration of Discharge Encounter: Greater than 30 minutes including physician time.  Signed, Gypsy Balsam, RN, BSN 10/20/2013, 8:35 AM  EP Attending  Patient seen and examined. Agree with above.  [name].D.

## 2013-10-20 NOTE — Op Note (Signed)
Jonathan Logan, Jonathan NO.:  [id number]  MEDICAL RECORD NO.:  [id number]  LOCATION:  6C08C                        FACILITY:  [facility]  PHYSICIAN:  Doylene Canning. Ladona Ridgel, MD    DATE OF BIRTH:  [birth year]  DATE OF PROCEDURE:  10/19/2013 DATE OF DISCHARGE:  10/20/2013                              OPERATIVE REPORT   PROCEDURE PERFORMED:  Insertion of a dual-chamber pacemaker.  INDICATION:  Congenital complete heart block with syncope.  INTRODUCTION:  The patient is a very pleasant 37 year old man who is admitted to the hospital after syncopal episode which occurred while he was coloring his beard.  He thought it was related to the fumes.  He was subsequently found to have complete heart block with a very slow ventricular rate in the high 20s and low 30s, although the rate would increased up into the 40s at times with persistence of complete heart block.  He is now referred for insertion of a dual-chamber pacemaker. He has normal left ventricular function.  DESCRIPTION OF PROCEDURE:  After informed consent was obtained, the patient was taken to the diagnostic EP lab in a fasting state.  After usual preparation and draping, intravenous fentanyl and midazolam was given for sedation.  A 30 mL of lidocaine was infiltrated into the left infraclavicular region.  A 5-cm incision was carried out over this region and electrocautery was utilized to dissect down the fascial plane.  The left cephalic vein was dissected free, but was found to be unusually small.  A guidewire could not be advanced into the vein and it was abandoned for IV access.  At this point, the subclavian vein was punctured and the Medtronic model 5076, 58 cm active fixation pacing lead, serial #[id number] was advanced to the right ventricle and the Medtronic model 5076, 52 cm active fixation pacing lead was advanced to the right atrium.  Mapping was first carried out in the right ventricle. At the final site, the  R-waves were 10, the pacing impedance was around 1200 ohms and threshold was initially 2 V at 0.5 milliseconds.  It should be noted that the only tried pacing once out of concerns of creating worsening heart block with 10 V paced.  There was a large injury current.  It should be noted that following PVCs during the case, the patient would have prolonged episodes of asystole typically for over 5 seconds.  At this point, attention was then turned to the atrial lead, was placed in anterolateral portion of the right atrium where P-waves measured 4 mV and with the lead actively fixed, the pacing impedance was 700 ohms and a threshold 0.5 V at 0.5 milliseconds.  Again, 10 V pacing did not stimulate the diaphragm.  With these satisfactory parameters, the leads were secured to the subpectoral fascia with a figure-of-eight silk suture.  The sewing sleeve was secured with silk suture.  The electrocautery was utilized to make subcutaneous pocket and assured hemostasis.  The pocket was irrigated with antibiotic irrigation.  The Medtronic adapter dual-chamber pacemaker serial [id number] was connected to the atrial and RV leads and placed back in the subcutaneous pocket.  The pocket was irrigated with antibiotic irrigation and the incision was closed  with 2-0, 3-0, and 4-0 Vicryl.  Benzoin and Steri- Strips were painted to the skin, a pressure dressing was applied, and the patient was returned to his room in satisfactory condition.  COMPLICATIONS:  There were no immediate procedure complications.  RESULTS:  Demonstrated successful implantation of a Medtronic dual- chamber pacemaker in the patient with symptomatic complete heart block.     Doylene Canning. Ladona Ridgel, MD     GWT/MEDQ  D:  10/19/2013  T:  10/20/2013  Job:  [id number]

## 2013-10-20 NOTE — Progress Notes (Signed)
Called to patient room.  Pt. C/O being hot with blurred vision, which resolved spontaneously within 5 minutes.  Walking in the hall with his wife at this time.  Magdalene Molly, RN, EP notified; will contact if further symptoms occur.

## 2013-10-20 NOTE — Progress Notes (Signed)
   ELECTROPHYSIOLOGY ROUNDING NOTE    Patient Name: Jonathan Logan Date of Encounter: 10/20/2013    SUBJECTIVE:Patient feels well this morning.  No chest pain or shortness of breath.  Moderate incisional soreness.   TELEMETRY: Reviewed telemetry pt in sinus rhythm with ventricular pacing Filed Vitals:   10/19/13 1724 10/19/13 1937 10/19/13 2307 10/20/13 0633  BP: 160/89 160/93 148/80 130/69  Pulse: 62 73 60 60  Temp: 98.2 F (36.8 C) 98.3 F (36.8 C) 98.2 F (36.8 C) 98.2 F (36.8 C)  TempSrc: Oral Oral Oral Oral  Resp: 16 18 20 20   Height:      Weight:    196 lb 3.4 oz (89 kg)  SpO2: 99% 98% 99% 95%    Intake/Output Summary (Last 24 hours) at 10/20/13 0713 Last data filed at 10/20/13 0643  Gross per 24 hour  Intake    393 ml  Output   1650 ml  Net  -1257 ml    CURRENT MEDICATIONS: .  ceFAZolin (ANCEF) IV  2 g Intravenous Q6H  . sodium chloride  3 mL Intravenous Q12H    LABS: Basic Metabolic Panel:  Recent Labs  40/98/11 2310 10/18/13 0510  NA 137 138  K 3.7 3.8  CL 99 102  CO2 30 25  GLUCOSE 105* 121*  BUN 11 9  CREATININE 0.86 0.80  CALCIUM 9.8 9.2  MG  --  1.9   Liver Function Tests:  Recent Labs  10/18/13 0510  AST 20  ALT 23  ALKPHOS 83  BILITOT 0.4  PROT 7.5  ALBUMIN 3.9   CBC:  Recent Labs  10/17/13 2310  WBC 6.7  NEUTROABS 2.3  HGB 15.6  HCT 43.6  MCV 87.0  PLT 210   Cardiac Enzymes:  Recent Labs  10/19/13 0340  TROPONINI <0.30   Thyroid Function Tests:  Recent Labs  10/18/13 0510  TSH 0.356    Radiology/Studies:  Pending  PHYSICAL EXAM Left chest with small hematoma.   DEVICE INTERROGATION: Device interrogated by industry.  Lead values including impedence, sensing, threshold within normal values.    Principal Problem:   Syncope Active Problems:   Mobitz type 2 second degree AV block   Congenital heart block    Rec: ok for discharge, usual followup and recommendations  Davari Lopes,M.D.

## 2013-10-26 ENCOUNTER — Telehealth: Payer: Self-pay | Admitting: Internal Medicine

## 2013-10-26 NOTE — Telephone Encounter (Signed)
Feels like he is having a panic attack and needs to go out and get air per the wife.  At times the advil and tylenol are not working for pain but she thinks he is just nervous that he is going to pass out again.  I explained to her that he needed reassurance and she understands and will try and help him.  He has a follow up already scheduled in the device clinic for his wound check and will keep this and call back if problems persist

## 2013-10-26 NOTE — Telephone Encounter (Signed)
New Problem  Pt wife calling.. States he is experiencing hot and dizzy spells.. Was advised to take Ibuprofen.Marland Kitchen Not working.. please call back to discuss.Marland Kitchen

## 2013-11-02 ENCOUNTER — Ambulatory Visit (INDEPENDENT_AMBULATORY_CARE_PROVIDER_SITE_OTHER): Payer: Medicaid Other | Admitting: *Deleted

## 2013-11-02 ENCOUNTER — Encounter: Payer: Self-pay | Admitting: *Deleted

## 2013-11-02 DIAGNOSIS — Q246 Congenital heart block: Secondary | ICD-10-CM

## 2013-11-02 LAB — MDC_IDC_ENUM_SESS_TYPE_INCLINIC
Battery Impedance: 100 Ohm
Battery Remaining Longevity: 124 mo
Battery Voltage: 2.79 V
Brady Statistic AP VP Percent: 14 %
Brady Statistic AP VS Percent: 0 %
Brady Statistic AS VP Percent: 86 %
Brady Statistic AS VS Percent: 0 %
Date Time Interrogation Session: 20141124123103
Lead Channel Impedance Value: 476 Ohm
Lead Channel Pacing Threshold Amplitude: 0.5 V
Lead Channel Pacing Threshold Pulse Width: 0.4 ms
Lead Channel Sensing Intrinsic Amplitude: 11.2 mV
Lead Channel Sensing Intrinsic Amplitude: 4 mV
Lead Channel Setting Pacing Amplitude: 3.5 V
Lead Channel Setting Pacing Amplitude: 3.5 V
Lead Channel Setting Pacing Pulse Width: 0.4 ms

## 2013-11-02 NOTE — Progress Notes (Signed)

## 2013-11-25 ENCOUNTER — Telehealth: Payer: Self-pay | Admitting: Internal Medicine

## 2013-11-25 NOTE — Telephone Encounter (Signed)
New Message  Pt's friend called.. States that the pt requests a call back to discuss why is often sleepy. Is it because of the pacemaker or medication please call to clarify.

## 2013-11-25 NOTE — Telephone Encounter (Signed)
Voicemail not set up. Unable to leave message.

## 2013-11-30 ENCOUNTER — Encounter: Payer: Self-pay | Admitting: Internal Medicine

## 2013-12-08 NOTE — Telephone Encounter (Signed)
Spoke with patient and he is better  Will call me back if he needs me

## 2013-12-31 ENCOUNTER — Telehealth: Payer: Self-pay | Admitting: Internal Medicine

## 2013-12-31 NOTE — Telephone Encounter (Signed)
New message     Pt tried to send remote transmission and do not think he did it correctly.  pls call

## 2013-12-31 NOTE — Telephone Encounter (Signed)
Spoke w/pt in regards to transmission/kwm  

## 2014-01-06 ENCOUNTER — Telehealth: Payer: Self-pay | Admitting: Internal Medicine

## 2014-01-06 NOTE — Telephone Encounter (Signed)
Pt has pacemaker/ last echo has no mention of valve replacement. Pt has OV 01/22/14 with Dr Ladona Ridgelaylor. I will forward for advice.

## 2014-01-06 NOTE — Telephone Encounter (Signed)
New Problem            [facility] RN Arrie AranStephie called they need a call back or fax saying wether pt needs to be pre medicated before any Dental work of any type? # [phone].  Pt will be scheduled for Extraction in the next couple of days.

## 2014-01-07 NOTE — Telephone Encounter (Signed)
Per Dr Ladona Ridgelaylor does not need SBE prior to extractions

## 2014-01-22 ENCOUNTER — Encounter: Payer: Self-pay | Admitting: Internal Medicine

## 2014-02-19 ENCOUNTER — Encounter: Payer: Self-pay | Admitting: Internal Medicine

## 2014-02-19 ENCOUNTER — Ambulatory Visit (INDEPENDENT_AMBULATORY_CARE_PROVIDER_SITE_OTHER): Payer: Medicaid Other | Admitting: Internal Medicine

## 2014-02-19 VITALS — BP 136/81 | HR 76 | Ht 68.5 in | Wt 199.0 lb

## 2014-02-19 DIAGNOSIS — Q246 Congenital heart block: Secondary | ICD-10-CM

## 2014-02-19 DIAGNOSIS — I498 Other specified cardiac arrhythmias: Secondary | ICD-10-CM

## 2014-02-19 DIAGNOSIS — R001 Bradycardia, unspecified: Secondary | ICD-10-CM

## 2014-02-19 DIAGNOSIS — I442 Atrioventricular block, complete: Secondary | ICD-10-CM

## 2014-02-19 DIAGNOSIS — Z950 Presence of cardiac pacemaker: Secondary | ICD-10-CM | POA: Insufficient documentation

## 2014-02-19 LAB — MDC_IDC_ENUM_SESS_TYPE_INCLINIC
Battery Impedance: 100 Ohm
Battery Remaining Longevity: 154 mo
Battery Voltage: 2.79 V
Brady Statistic AP VP Percent: 6 %
Brady Statistic AP VS Percent: 0 %
Brady Statistic AS VS Percent: 0 %
Date Time Interrogation Session: 20150313191158
Lead Channel Impedance Value: 475 Ohm
Lead Channel Impedance Value: 638 Ohm
Lead Channel Pacing Threshold Pulse Width: 0.4 ms
Lead Channel Pacing Threshold Pulse Width: 0.4 ms
Lead Channel Sensing Intrinsic Amplitude: 4 mV
Lead Channel Setting Sensing Sensitivity: 2.8 mV
MDC IDC MSMT LEADCHNL RA PACING THRESHOLD AMPLITUDE: 0.75 V
MDC IDC MSMT LEADCHNL RV PACING THRESHOLD AMPLITUDE: 0.75 V
MDC IDC MSMT LEADCHNL RV SENSING INTR AMPL: 11.2 mV
MDC IDC SET LEADCHNL RA PACING AMPLITUDE: 2 V
MDC IDC SET LEADCHNL RV PACING AMPLITUDE: 2.5 V
MDC IDC SET LEADCHNL RV PACING PULSEWIDTH: 0.4 ms
MDC IDC STAT BRADY AS VP PERCENT: 93 %

## 2014-02-19 NOTE — Patient Instructions (Addendum)
Your physician recommends that you continue on your current medications as directed. Please refer to the Current Medication list given to you today.  Remote monitoring is used to monitor your pacemaker from home. This monitoring reduces the number of office visits required to check your device to one time per year. It allows us to keep an eye on the functioning of your device to ensure it is working properly. You are scheduled for a device check from home on 05-24-2014. You may send your transmission at any time that day. If you have a wireless device, the transmission will be sent automatically. After your physician reviews your transmission, you will receive a postcard with your next transmission date.  Your physician recommends that you schedule a follow-up appointment in: 12 months with Dr.Taylor    

## 2014-02-19 NOTE — Progress Notes (Signed)
      HPI [name] returns today for follow up. He is a pleasant 38 yo man with a h/o congenital complete heart block, s/p PPM insertion. He denies chest pain or sob or peripheral edema. He remains active and exercises regularly.  No Known Allergies   Current Outpatient Prescriptions  Medication Sig Dispense Refill  . ibuprofen (ADVIL,MOTRIN) 800 MG tablet Take 800 mg by mouth every 8 (eight) hours as needed for moderate pain.       No current facility-administered medications for this visit.     Past Medical History  Diagnosis Date  . H/O congenital heart block   . Syncope   . Mobitz type 2 second degree AV block   . Complete heart block   . Congenital heart block     ROS:   All systems reviewed and negative except as noted in the HPI.   History reviewed. No pertinent past surgical history.   No family history on file.   History   Social History  . Marital Status: Single    Spouse Name: N/A    Number of Children: N/A  . Years of Education: N/A   Occupational History  . Not on file.   Social History Main Topics  . Smoking status: Former Smoker -- 0.05 packs/day    Types: Cigarettes  . Smokeless tobacco: Not on file     Comment: VERY LIGHT, OCCASIONAL SMOKER  . Alcohol Use: Yes  . Drug Use: No  . Sexual Activity: Not on file   Other Topics Concern  . Not on file   Social History Narrative  . No narrative on file     BP 136/81  Pulse 76  Ht 5' 8.5" (1.74 m)  Wt 199 lb (90.266 kg)  BMI 29.81 kg/m2  Physical Exam:  Well appearing young man, NAD HEENT: Unremarkable Neck:  No JVD, no thyromegally Back:  No CVA tenderness Lungs:  Clear with no wheezes, well healed PPM incision HEART:  Regular rate rhythm, no murmurs, no rubs, no clicks Abd:  soft, positive bowel sounds, no organomegally, no rebound, no guarding Ext:  2 plus pulses, no edema, no cyanosis, no clubbing Skin:  No rashes no nodules Neuro:  CN II through XII intact, motor  grossly intact  EKG - NSR with P synchronous ventricular pacing  DEVICE  Normal device function.  See PaceArt for details.   Assess/Plan:

## 2014-02-19 NOTE — Assessment & Plan Note (Signed)
His Medtronic DDD PM is working normally. Will recheck in several months. 

## 2014-03-11 ENCOUNTER — Encounter: Payer: Self-pay | Admitting: Internal Medicine

## 2014-05-23 ENCOUNTER — Encounter (HOSPITAL_COMMUNITY): Payer: Self-pay | Admitting: Emergency Medicine

## 2014-05-23 ENCOUNTER — Emergency Department (INDEPENDENT_AMBULATORY_CARE_PROVIDER_SITE_OTHER): Payer: Medicaid Other

## 2014-05-23 ENCOUNTER — Emergency Department (HOSPITAL_COMMUNITY)
Admission: EM | Admit: 2014-05-23 | Discharge: 2014-05-23 | Disposition: A | Discharge status: Home / Self Care | Payer: Medicaid Other | Source: Home / Self Care | Attending: Emergency Medicine | Admitting: Emergency Medicine

## 2014-05-23 DIAGNOSIS — M25559 Pain in unspecified hip: Secondary | ICD-10-CM

## 2014-05-23 DIAGNOSIS — M25552 Pain in left hip: Secondary | ICD-10-CM

## 2014-05-23 MED ORDER — METHYLPREDNISOLONE ACETATE 40 MG/ML IJ SUSP
INTRAMUSCULAR | Status: AC
  Filled 2014-05-23: qty 5

## 2014-05-23 MED ORDER — DICLOFENAC SODIUM 75 MG PO TBEC: 75.0000 mg | DELAYED_RELEASE_TABLET | Freq: Two times a day (BID) | ORAL | Status: DC

## 2014-05-23 MED ORDER — HYDROCODONE-ACETAMINOPHEN 5-325 MG PO TABS: ORAL_TABLET | ORAL | Status: DC

## 2014-05-23 NOTE — Discharge Instructions (Signed)
Most hip pain is caused by osteoarthritis, bursitis, or tendonitis.  Simple measures plus regular gentle exercises can help.  Do not do the following:  Avoid squatting and doing deep knee bends.  This puts too much of load on your cartiledges and tendons.  If you do a knee bend, go only half way down, flexing your knee no more than 90 degrees.  Avoid sleeping on the side that hurts.  Do the following:  If you are overweight or obese, lose weight.  This makes for a lot less load on your hip joints.  If you use tobacco, quit.  Nicotine causes spasm of the small arteries, decreases blood flow, and impairs your body's normal ability to repair damage.  If your hip is acutely inflamed, use the principles of RICE (rest, ice, compression, and elevation).  Use of over the counter pain meds can be of help.  Tylenol (or acetaminophen) is the safest to use.  It often helps to take this regularly.  You can take up to 2 325 mg tablets 5 times daily, but it best to start out much lower that that, perhaps 2 325 mg tablets twice daily, then increase from there. People who are on the blood thinner warfarin have to be careful about taking high doses of Tylenol.  For people who are able to tolerate them, ibuprofen and naproxyn can also help with the pain.  You should discuss these agents with your physician before taking them.  People with chronic kidney disease, hypertension, peptic ulcer disease, and reflux can suffer adverse side effects. They should not be taken with warfarin. The maximum dosage of ibuprofen is 800 mg 3 times daily with meals.  The maximum dosage of naprosyn is 2 and 1/2 tablets twice daily with food, but again, start out low and gradually increase the dose until adequate pain relief is achieved. Ibuprofen and naprosyn should always be taken with food.  People with cartiledge injury or osteoarthritis may find glucosamine to be helpful.  This is an over-the-counter supplement that helps nourish and  repair cartiledge.  The dose is 500 mg 3 times daily or 1500 mg taken in a single dose. This can take several months to work and it doesn't always work.    For people with hip pain on just one side, use of a cane held in the hand on the same side as the hip pain takes some of the stress off the hip joint and can make a big difference in hip pain.  Wearing good shoes with adequate arch support is essential. Use of an orthotic insert can be very helpful.  These can be purchased at a shoe store or inexpensive inserts can be gotten at the drug store.  Regular exercise is of utmost importance.  Swimming, water aerobics, low impact aerobics, yoga or tai chi are helpful.  Use of an elliptical exerciser put the least stress on the hips of any type of exercise machine.  Finally doing the exercises below can be very helpful. Try to do them twice a day followed by ice for 10 minutes.

## 2014-05-23 NOTE — ED Notes (Signed)
Patient complains of lower back pain and left hip pain; states left hip catches and causes pain; pain started last Friday; states on Friday back caught and he couldn't get out of bed.

## 2014-05-23 NOTE — ED Provider Notes (Signed)
Chief Complaint    Chief Complaint  Patient presents with  . Back Pain  . Hip Pain    History of Present Illness     Cammie McgeeQuashaun D Wagler is a 38 year old male who has had a one-month history of intermittent pain that's centered around the lateral left hip and radiates into the lateral thigh but not below the knee. The pain has been intermittent but is becoming more frequent and more severe. The patient is very physically active at work. He does a lot of lifting and order picking. The pain is a sharp aching pain, described also as a pulling and a knot. It's worse if he straightens up from a bent over position. One morning he could not get out of bed. The pain is also worse at work. He denies any numbness or tingling in the leg. There's been no muscle weakness, bladder or bowel dysfunction, or saddle anesthesia. He denies any pain in his lower back. There is no abdominal pain, nausea, or vomiting. He denies any fever, chills, weight loss, or history of cancer.  Review of Systems     Other than as noted above, the patient denies any of the following symptoms: Systemic:  No fevers or chills.   Musculoskeletal:  No joint pain, arthritis, back pain, or neck pain. Neurological:  No muscular weakness or paresthesias.  PMFSH    Past medical history, family history, social history, meds, and allergies were reviewed.    Physical Exam    Vital signs:  BP 145/74  Pulse 84  Temp(Src) 98.4 F (36.9 C) (Oral)  Resp 16  SpO2 99% Gen:  Alert and oriented times 3.  In no distress. Abdomen: Soft, nontender, no organomegaly or mass. No inguinal lymphadenopathy, mass, or tenderness. Back: There was slight pain to palpation in the lower back, particularly over the sacroiliac joint. The back had a full range of motion with no pain. Straight leg raising was negative. Musculoskeletal: There is pain to palpation over the greater trochanter. No swelling, erythema, or crepitus. The hip joint had a full range  of motion. There is pain on flexion past 90. Faber maneuver was negative. Fadir maneuver was positive.  Otherwise, all joints had a full a ROM with no swelling, bruising or deformity.  No edema, pulses full. Extremities were warm and pink.  Capillary refill was brisk.  Skin:  Clear, warm and dry.  No rash. Neuro:  Alert and oriented times 3.  Muscle strength was normal.  Sensation was intact to light touch.    Radiology     Dg Hip Complete Left  05/23/2014   CLINICAL DATA:  Left hip pain for 2 months  EXAM: LEFT HIP - COMPLETE 2+ VIEW  COMPARISON:  None.  FINDINGS: There is no evidence of hip fracture or dislocation. There is no evidence of arthropathy or other focal bone abnormality.  IMPRESSION: Negative.   Electronically Signed   By: Signa Kellaylor  Stroud M.D.   On: 05/23/2014 15:45   I reviewed the images independently and personally and concur with the radiologist's findings.  Procedure Note:  Verbal informed consent was obtained from the patient.  Risks and benefits were outlined with the patient.  Patient understands and accepts these risks. A time out was called and the procedure and identity of the patient were confirmed verbally.    The procedure was then performed as follows:  The area over the greater trochanter was identified and prepped with Betadine and alcohol and anesthetized with ethyl chloride spray.  Using 1/2 inch 27-gauge needle, 1 cc of Depo-Medrol 40 mg strength and 2 cc of 2% Xylocaine were instilled just over the greater trochanter. A Band-Aid dressing was applied.  The patient tolerated the procedure well without any immediate complications.    Assessment    The encounter diagnosis was Left hip pain.  Hip pain most likely due to trochanteric bursitis or gluteus medius tendinitis. Other possibilities include hip impingement syndrome, iliotibial band syndrome, or lumbar radiculopathy.  Plan   1.  Meds:  The following meds were prescribed:   Discharge Medication List as  of 05/23/2014  4:21 PM    START taking these medications   Details  diclofenac (VOLTAREN) 75 MG EC tablet Take 1 tablet (75 mg total) by mouth 2 (two) times daily., Starting 05/23/2014, Until Discontinued, Normal    HYDROcodone-acetaminophen (NORCO/VICODIN) 5-325 MG per tablet 1 to 2 tabs every 4 to 6 hours as needed for pain., Print        2.  Patient Education/Counseling:  The patient was given appropriate handouts, self care instructions, and instructed in symptomatic relief, including rest and activity, elevation, application of ice and compression.  Given aftercare instructions, a return to work on Tuesday or Wednesday  3.  Follow up:  The patient was told to follow up here if no better in 3 to 4 days, or sooner if becoming worse in any way, and given some red flag symptoms such as worsening pain or new neurological symptoms which would prompt immediate return.  Follow up with Dr. Renaye Rakersim Murphy in 2 weeks.     Reuben Likesavid C Jylian Pappalardo, MD 05/23/14 [phone]

## 2014-05-24 ENCOUNTER — Ambulatory Visit (INDEPENDENT_AMBULATORY_CARE_PROVIDER_SITE_OTHER): Payer: Medicaid Other | Admitting: *Deleted

## 2014-05-24 DIAGNOSIS — I442 Atrioventricular block, complete: Secondary | ICD-10-CM

## 2014-05-24 DIAGNOSIS — Z950 Presence of cardiac pacemaker: Secondary | ICD-10-CM

## 2014-05-24 LAB — MDC_IDC_ENUM_SESS_TYPE_REMOTE
Battery Remaining Longevity: 126 mo
Battery Voltage: 2.79 V
Brady Statistic AP VP Percent: 8.1 %
Brady Statistic AS VP Percent: 91.8 %
Brady Statistic AS VS Percent: 0.1 %
Lead Channel Pacing Threshold Amplitude: 1 V
Lead Channel Pacing Threshold Pulse Width: 0.4 ms
Lead Channel Sensing Intrinsic Amplitude: 2.8 mV
Lead Channel Setting Pacing Amplitude: 2 V
Lead Channel Setting Pacing Pulse Width: 0.4 ms
Lead Channel Setting Sensing Sensitivity: 2.8 mV
MDC IDC MSMT LEADCHNL RA IMPEDANCE VALUE: 419 Ohm
MDC IDC MSMT LEADCHNL RA PACING THRESHOLD AMPLITUDE: 0.625 V
MDC IDC MSMT LEADCHNL RA PACING THRESHOLD PULSEWIDTH: 0.4 ms
MDC IDC MSMT LEADCHNL RV IMPEDANCE VALUE: 580 Ohm
MDC IDC SET LEADCHNL RV PACING AMPLITUDE: 2.5 V
MDC IDC STAT BRADY AP VS PERCENT: 0.1 %

## 2014-05-25 NOTE — Progress Notes (Signed)
Remote pacemaker transmission.   

## 2014-06-01 ENCOUNTER — Encounter: Payer: Self-pay | Admitting: Cardiology

## 2014-06-03 ENCOUNTER — Encounter: Payer: Self-pay | Admitting: Internal Medicine

## 2014-06-21 ENCOUNTER — Encounter (HOSPITAL_COMMUNITY): Payer: Self-pay | Admitting: Emergency Medicine

## 2014-06-21 ENCOUNTER — Emergency Department (INDEPENDENT_AMBULATORY_CARE_PROVIDER_SITE_OTHER)
Admission: EM | Admit: 2014-06-21 | Discharge: 2014-06-21 | Disposition: A | Discharge status: Home / Self Care | Payer: Medicaid Other | Source: Home / Self Care | Attending: Family Medicine | Admitting: Family Medicine

## 2014-06-21 DIAGNOSIS — L255 Unspecified contact dermatitis due to plants, except food: Secondary | ICD-10-CM

## 2014-06-21 DIAGNOSIS — L237 Allergic contact dermatitis due to plants, except food: Secondary | ICD-10-CM

## 2014-06-21 MED ORDER — HYDROXYZINE HCL 25 MG PO TABS: 25.0000 mg | ORAL_TABLET | Freq: Three times a day (TID) | ORAL | Status: DC | PRN

## 2014-06-21 NOTE — ED Provider Notes (Signed)
Medical screening examination/treatment/procedure(s) were performed by resident physician or non-physician practitioner and as supervising physician I was immediately available for consultation/collaboration.   KINDL,JAMES DOUGLAS MD.   James D Kindl, MD 06/21/14 2122 

## 2014-06-21 NOTE — ED Notes (Signed)
Pt reports a rash on both arms since Saturday States he was working out in the yard and was exposed to poison ivy Has been using calamine lotion Alert w/no signs of acuate distress

## 2014-06-21 NOTE — Discharge Instructions (Signed)
May also use topical 1% hydrocortisone cream or topical benadryl cream for itching.  Poison [employer] Poison ivy is a inflammation of the skin (contact dermatitis) caused by touching the allergens on the leaves of the ivy plant following previous exposure to the plant. The rash usually appears 48 hours after exposure. The rash is usually bumps (papules) or blisters (vesicles) in a linear pattern. Depending on your own sensitivity, the rash may simply cause redness and itching, or it may also progress to blisters which may break open. These must be well cared for to prevent secondary bacterial (germ) infection, followed by scarring. Keep any open areas dry, clean, dressed, and covered with an antibacterial ointment if needed. The eyes may also get puffy. The puffiness is worst in the morning and gets better as the day progresses. This dermatitis usually heals without scarring, within 2 to 3 weeks without treatment. HOME CARE INSTRUCTIONS  Thoroughly wash with soap and water as soon as you have been exposed to poison ivy. You have about one half hour to remove the plant resin before it will cause the rash. This washing will destroy the oil or antigen on the skin that is causing, or will cause, the rash. Be sure to wash under your fingernails as any plant resin there will continue to spread the rash. Do not rub skin vigorously when washing affected area. Poison ivy cannot spread if no oil from the plant remains on your body. A rash that has progressed to weeping sores will not spread the rash unless you have not washed thoroughly. It is also important to wash any clothes you have been wearing as these may carry active allergens. The rash will return if you wear the unwashed clothing, even several days later. Avoidance of the plant in the future is the best measure. Poison ivy plant can be recognized by the number of leaves. Generally, poison ivy has three leaves with flowering branches on a single stem. Diphenhydramine  may be purchased over the counter and used as needed for itching. Do not drive with this medication if it makes you drowsy.Ask your caregiver about medication for children. SEEK MEDICAL CARE IF:  Open sores develop.  Redness spreads beyond area of rash.  You notice purulent (pus-like) discharge.  You have increased pain.  Other signs of infection develop (such as fever). Document Released: 11/23/2000 Document Revised: 02/18/2012 Document Reviewed: 10/12/2009 [facility] Patient Information 2015 [place], [place]. This information is not intended to replace advice given to you by your health care provider. Make sure you discuss any questions you have with your health care provider.

## 2014-06-21 NOTE — ED Provider Notes (Signed)
CSN: [id number]     Arrival date & time 06/21/14  1421 History   First MD Initiated Contact with Patient 06/21/14 1550     Chief Complaint  Patient presents with  . Poison Ivy   (Consider location/radiation/quality/duration/timing/severity/associated sxs/prior Treatment) Patient is a 38 y.o. male presenting with poison ivy. The history is provided by the patient.  [name] This is a new problem. Episode onset: sx began 3 days ago after working in his yard. The problem occurs constantly. The problem has been gradually improving. Associated symptoms comments: +rash with itching.    Past Medical History  Diagnosis Date  . H/O congenital heart block   . Syncope   . Mobitz type 2 second degree AV block   . Complete heart block   . Congenital heart block    History reviewed. No pertinent past surgical history. No family history on file. History  Substance Use Topics  . Smoking status: Former Smoker -- 0.05 packs/day    Types: Cigarettes  . Smokeless tobacco: Not on file     Comment: VERY LIGHT, OCCASIONAL SMOKER  . Alcohol Use: Yes    Review of Systems  All other systems reviewed and are negative.   Allergies  Review of patient's allergies indicates no known allergies.  Home Medications   Prior to Admission medications   Medication Sig Start Date End Date Taking? Authorizing Provider  diclofenac (VOLTAREN) 75 MG EC tablet Take 1 tablet (75 mg total) by mouth 2 (two) times daily. 05/23/14   Reuben Likesavid C Keller, MD  HYDROcodone-acetaminophen (NORCO/VICODIN) 5-325 MG per tablet 1 to 2 tabs every 4 to 6 hours as needed for pain. 05/23/14   Reuben Likesavid C Keller, MD  hydrOXYzine (ATARAX/VISTARIL) 25 MG tablet Take 1 tablet (25 mg total) by mouth 3 (three) times daily as needed for itching. 06/21/14   Ardis RowanJennifer Lee Rosha Cocker, PA  ibuprofen (ADVIL,MOTRIN) 800 MG tablet Take 800 mg by mouth every 8 (eight) hours as needed for moderate pain.    Historical Provider, MD   BP 132/86  Pulse 83   Temp(Src) 98 F (36.7 C) (Oral)  Resp 16  SpO2 100% Physical Exam  Nursing note and vitals reviewed. Constitutional: He is oriented to person, place, and time. He appears well-developed and well-nourished. No distress.  HENT:  Head: Normocephalic and atraumatic.  Eyes: Conjunctivae are normal. No scleral icterus.  Cardiovascular: Normal rate, regular rhythm and normal heart sounds.   Pulmonary/Chest: Effort normal and breath sounds normal. No respiratory distress. He has no wheezes.  Musculoskeletal: Normal range of motion.  Neurological: He is alert and oriented to person, place, and time.  Skin: Skin is warm and dry. Rash noted.  Erythematous papular contact dermatitis at bilateral upper extremities and anterior chest  Psychiatric: He has a normal mood and affect. His behavior is normal.    ED Course  Procedures (including critical care time) Labs Review Labs Reviewed - No data to display  Imaging Review No results found.   MDM   1. Poison ivy dermatitis   Atarax as prescribed with topical 1% hydrocortisone cream or topical benadryl cream for itching. Follow up prn.    Ardis RowanJennifer Lee Lataja [place], PA 06/21/14 [phone]

## 2014-08-24 ENCOUNTER — Emergency Department (HOSPITAL_COMMUNITY)
Admission: EM | Admit: 2014-08-24 | Discharge: 2014-08-24 | Disposition: A | Discharge status: Home / Self Care | Payer: Medicaid Other | Attending: Emergency Medicine | Admitting: Emergency Medicine

## 2014-08-24 ENCOUNTER — Encounter (HOSPITAL_COMMUNITY): Payer: Self-pay | Admitting: Emergency Medicine

## 2014-08-24 DIAGNOSIS — Z8679 Personal history of other diseases of the circulatory system: Secondary | ICD-10-CM | POA: Diagnosis not present

## 2014-08-24 DIAGNOSIS — Z87891 Personal history of nicotine dependence: Secondary | ICD-10-CM | POA: Diagnosis not present

## 2014-08-24 DIAGNOSIS — M25552 Pain in left hip: Secondary | ICD-10-CM

## 2014-08-24 DIAGNOSIS — Q246 Congenital heart block: Secondary | ICD-10-CM | POA: Diagnosis not present

## 2014-08-24 DIAGNOSIS — M25559 Pain in unspecified hip: Secondary | ICD-10-CM | POA: Insufficient documentation

## 2014-08-24 DIAGNOSIS — M25551 Pain in right hip: Secondary | ICD-10-CM

## 2014-08-24 MED ORDER — PREDNISONE 10 MG PO TABS: 20.0000 mg | ORAL_TABLET | Freq: Every day | ORAL | Status: DC

## 2014-08-24 MED ORDER — CYCLOBENZAPRINE HCL 5 MG PO TABS: 10.0000 mg | ORAL_TABLET | Freq: Two times a day (BID) | ORAL | Status: DC | PRN

## 2014-08-24 MED ORDER — TRAMADOL HCL 50 MG PO TABS: 50.0000 mg | ORAL_TABLET | Freq: Four times a day (QID) | ORAL | Status: DC | PRN

## 2014-08-24 NOTE — Discharge Instructions (Signed)
Hip Pain Your hip is the joint between your upper legs and your lower pelvis. The bones, cartilage, tendons, and muscles of your hip joint perform a lot of work each day supporting your body weight and allowing you to move around. Hip pain can range from a minor ache to severe pain in one or both of your hips. Pain may be felt on the inside of the hip joint near the groin, or the outside near the buttocks and upper thigh. You may have swelling or stiffness as well.  HOME CARE INSTRUCTIONS   Take medicines only as directed by your health care provider.  Apply ice to the injured area:  Put ice in a plastic bag.  Place a towel between your skin and the bag.  Leave the ice on for 15-20 minutes at a time, 3-4 times a day.  Keep your leg raised (elevated) when possible to lessen swelling.  Avoid activities that cause pain.  Follow specific exercises as directed by your health care provider.  Sleep with a pillow between your legs on your most comfortable side.  Record how often you have hip pain, the location of the pain, and what it feels like. SEEK MEDICAL CARE IF:   You are unable to put weight on your leg.  Your hip is red or swollen or very tender to touch.  Your pain or swelling continues or worsens after 1 week.  You have increasing difficulty walking.  You have a fever. SEEK IMMEDIATE MEDICAL CARE IF:   You have fallen.  You have a sudden increase in pain and swelling in your hip. MAKE SURE YOU:   Understand these instructions.  Will watch your condition.  Will get help right away if you are not doing well or get worse. Document Released: 05/16/2010 Document Revised: 04/12/2014 Document Reviewed: 07/23/2013 ExitCare Patient Information 2015 Exit[employer], LLC. This information is not intended to replace advice given to you by your health care provider. Make sure you discuss any questions you have with your health care provider.  

## 2014-08-24 NOTE — ED Notes (Signed)
Pt c/o back to leg pain, states pain shoots down both sides. Denies injury.

## 2014-08-24 NOTE — ED Provider Notes (Signed)
CSN: [id number]     Arrival date & time 08/24/14  0935 History   First MD Initiated Contact with Patient 08/24/14 1011     Chief Complaint  Patient presents with  . Leg Pain  . Hip Pain  . Back Pain     (Consider location/radiation/quality/duration/timing/severity/associated sxs/prior Treatment) HPI  Patient to the ER with complaints of hip pain. He has had pains that started since July of 2015, saw Dr. Lorenz Coaster at the Urgent Care where he had an injection in his hip that helped for 2 weeks. Since then he continues to have pain that "jumps" back and forth between both hips. 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 He reports trying "everything over the counter" but nothing helps much. He is also having cramping. He has not had any weakness or numbness. He has mild lower lumbar pain.  Past Medical History  Diagnosis Date  . H/O congenital heart block   . Syncope   . Mobitz type 2 second degree AV block   . Complete heart block   . Congenital heart block    History reviewed. No pertinent past surgical history. No family history on file. History  Substance Use Topics  . Smoking status: Former Smoker -- 0.05 packs/day    Types: Cigarettes  . Smokeless tobacco: Not on file     Comment: VERY LIGHT, OCCASIONAL SMOKER  . Alcohol Use: Yes    Review of Systems   Review of Systems  Gen: no weight loss, fevers, chills, night sweats  Eyes: no occular draining, occular pain,  No visual changes  Nose: no epistaxis or rhinorrhea  Mouth: no dental pain, no sore throat  Neck: no neck pain  Lungs: No hemoptysis. No wheezing or coughing CV:  No palpitations, dependent edema or orthopnea. No chest pain Abd: no diarrhea. No nausea or vomiting, No abdominal pain  GU: no dysuria or gross hematuria  MSK:  No muscle weakness, + muscular pain Neuro: no headache, no focal neurologic deficits   Skin: no rash , no wounds Psyche: no complaints of depression or anxiety    Allergies  Review of patient's allergies indicates no known allergies.  Home Medications   Prior to Admission medications   Medication Sig Start Date End Date Taking? Authorizing Provider  hydrOXYzine (ATARAX/VISTARIL) 25 MG tablet Take 1 tablet (25 mg total) by mouth 3 (three) times daily as needed for itching. 06/21/14  Yes Mathis Fare Presson, PA  ibuprofen (ADVIL,MOTRIN) 200 MG tablet Take 600 mg by mouth every 6 (six) hours as needed for moderate pain.   Yes Historical Provider, MD  naproxen sodium (ANAPROX) 220 MG tablet Take 440 mg by mouth 2 (two) times daily as needed (for pain).   Yes Historical Provider, MD  cyclobenzaprine (FLEXERIL) 5 MG tablet Take 2 tablets (10 mg total) by mouth 2 (two) times daily as needed for muscle spasms. 08/24/14   Jihaad Bruschi Irine Seal, PA-C  predniSONE (DELTASONE) 10 MG tablet Take 2 tablets (20 mg total) by mouth daily. 08/24/14   Vonette Grosso Irine Seal, PA-C  traMADol (ULTRAM) 50 MG tablet Take 1 tablet (50 mg total) by mouth every 6 (six) hours as needed. 08/24/14   Bejamin Hackbart Irine Seal, PA-C   BP 148/83  Pulse 74  Temp(Src) 98.7 F (37.1 C) (Oral)  Resp 18  SpO2 100% Physical Exam  Nursing note and vitals  reviewed. Constitutional: He appears well-developed and well-nourished. No distress.  HENT:  Head: Normocephalic and atraumatic.  Eyes: Pupils are equal, round, and reactive to light.  Neck: Normal range of motion. Neck supple.  Cardiovascular: Normal rate and regular rhythm.   Pulmonary/Chest: Effort normal.  Abdominal: Soft.  Musculoskeletal:  Pt has equal strength to bilateral lower extremities.  Neurosensory function adequate to both legs No clonus on dorsiflextion Skin color is normal. Skin is warm and moist.  I see no step off deformity, no midline bony tenderness.  Pt is able to ambulate.  No crepitus, laceration, effusion, induration, lesions, swelling.    Pedal pulses are symmetrical and palpable bilaterally  no tenderness to palpation of paraspinel muscles   Neurological: He is alert.  Skin: Skin is warm and dry.    ED Course  Procedures (including critical care time) Labs Review Labs Reviewed - No data to display  Imaging Review No results found.   EKG Interpretation None      MDM   Final diagnoses:  Hip pain, bilateral    38 y.o.Rogen D Mirabal's  with back pain. No neurological deficits and normal neuro exam. Patient can walk. No loss of bowel or bladder control. No concern for cauda equina at this time base on HPI and physical exam findings. No fever, night sweats, weight loss, h/o cancer, IVDU.   RICE protocol and pain medicine indicated and discussed with patient.   Patient Plan 1. Medications: pain medication and muscle relaxer. Cont usual home medications unless otherwise directed. 2. Treatment: rest, drink plenty of fluids, gentle stretching as discussed, alternate ice and heat  3. Follow Up: Please followup with your primary doctor for discussion of your diagnoses and further evaluation after today's visit; if you do not have a primary care doctor use the resource guide provided to find one   Vital signs are stable at discharge. Filed Vitals:   08/24/14 0939  BP: 148/83  Pulse: 74  Temp: 98.7 F (37.1 C)  Resp: 18    Patient/guardian has voiced understanding and agreed to follow-up with the PCP or specialist.         Dorthula Matas, PA-C 08/24/14 1103

## 2014-08-24 NOTE — ED Provider Notes (Signed)
Medical screening examination/treatment/procedure(s) were performed by non-physician practitioner and as supervising physician I was immediately available for consultation/collaboration.  Kimberlyn Quiocho T Nyana Haren, MD 08/24/14 1619 

## 2014-08-25 ENCOUNTER — Ambulatory Visit (INDEPENDENT_AMBULATORY_CARE_PROVIDER_SITE_OTHER): Payer: Medicaid Other | Admitting: *Deleted

## 2014-08-25 DIAGNOSIS — I442 Atrioventricular block, complete: Secondary | ICD-10-CM

## 2014-08-25 NOTE — Progress Notes (Signed)
Remote pacemaker transmission.   

## 2014-09-12 LAB — MDC_IDC_ENUM_SESS_TYPE_REMOTE
Battery Impedance: 100 Ohm
Battery Remaining Longevity: 136 mo
Brady Statistic AS VS Percent: 0 %
Date Time Interrogation Session: 20150916103338
Lead Channel Impedance Value: 591 Ohm
Lead Channel Pacing Threshold Amplitude: 0.75 V
Lead Channel Pacing Threshold Pulse Width: 0.4 ms
Lead Channel Pacing Threshold Pulse Width: 0.4 ms
Lead Channel Setting Pacing Amplitude: 2 V
Lead Channel Setting Pacing Pulse Width: 0.4 ms
Lead Channel Setting Sensing Sensitivity: 2.8 mV
MDC IDC MSMT BATTERY VOLTAGE: 2.79 V
MDC IDC MSMT LEADCHNL RA IMPEDANCE VALUE: 418 Ohm
MDC IDC MSMT LEADCHNL RA PACING THRESHOLD AMPLITUDE: 0.625 V
MDC IDC MSMT LEADCHNL RA SENSING INTR AMPL: 2.8 mV
MDC IDC SET LEADCHNL RV PACING AMPLITUDE: 2.5 V
MDC IDC STAT BRADY AP VP PERCENT: 8 %
MDC IDC STAT BRADY AP VS PERCENT: 0 %
MDC IDC STAT BRADY AS VP PERCENT: 92 %

## 2014-10-01 ENCOUNTER — Encounter: Payer: Self-pay | Admitting: *Deleted

## 2014-10-15 ENCOUNTER — Encounter: Payer: Self-pay | Admitting: Internal Medicine

## 2014-11-18 ENCOUNTER — Encounter (HOSPITAL_COMMUNITY): Payer: Self-pay | Admitting: Internal Medicine

## 2014-11-29 ENCOUNTER — Ambulatory Visit (INDEPENDENT_AMBULATORY_CARE_PROVIDER_SITE_OTHER): Payer: Medicaid Other | Admitting: *Deleted

## 2014-11-29 ENCOUNTER — Telehealth: Payer: Self-pay | Admitting: Cardiology

## 2014-11-29 DIAGNOSIS — I442 Atrioventricular block, complete: Secondary | ICD-10-CM

## 2014-11-29 NOTE — Telephone Encounter (Signed)
Spoke with pt and reminded pt of remote transmission that is due today. Pt verbalized understanding.   

## 2014-11-29 NOTE — Progress Notes (Signed)
Remote pacemaker transmission.   

## 2014-11-30 LAB — MDC_IDC_ENUM_SESS_TYPE_REMOTE
Battery Remaining Longevity: 134 mo
Battery Voltage: 2.79 V
Brady Statistic AP VP Percent: 8 %
Brady Statistic AP VS Percent: 0 %
Brady Statistic AS VP Percent: 92 %
Lead Channel Impedance Value: 443 Ohm
Lead Channel Impedance Value: 619 Ohm
Lead Channel Pacing Threshold Amplitude: 0.5 V
Lead Channel Pacing Threshold Amplitude: 0.875 V
Lead Channel Pacing Threshold Pulse Width: 0.4 ms
Lead Channel Sensing Intrinsic Amplitude: 2.8 mV
Lead Channel Setting Pacing Amplitude: 2 V
Lead Channel Setting Pacing Amplitude: 2.5 V
MDC IDC MSMT BATTERY IMPEDANCE: 112 Ohm
MDC IDC MSMT LEADCHNL RA PACING THRESHOLD PULSEWIDTH: 0.4 ms
MDC IDC SESS DTM: 20151221194840
MDC IDC SET LEADCHNL RV PACING PULSEWIDTH: 0.4 ms
MDC IDC SET LEADCHNL RV SENSING SENSITIVITY: 2.8 mV
MDC IDC STAT BRADY AS VS PERCENT: 0 %

## 2014-12-07 ENCOUNTER — Encounter: Payer: Self-pay | Admitting: Cardiology

## 2014-12-23 ENCOUNTER — Encounter: Payer: Self-pay | Admitting: Internal Medicine

## 2015-02-22 ENCOUNTER — Ambulatory Visit (INDEPENDENT_AMBULATORY_CARE_PROVIDER_SITE_OTHER): Payer: Medicaid Other | Admitting: Internal Medicine

## 2015-02-22 ENCOUNTER — Encounter: Payer: Self-pay | Admitting: Internal Medicine

## 2015-02-22 VITALS — BP 120/82 | HR 80 | Ht 68.5 in | Wt 210.8 lb

## 2015-02-22 DIAGNOSIS — I442 Atrioventricular block, complete: Secondary | ICD-10-CM

## 2015-02-22 DIAGNOSIS — I459 Conduction disorder, unspecified: Secondary | ICD-10-CM

## 2015-02-22 DIAGNOSIS — Z950 Presence of cardiac pacemaker: Secondary | ICD-10-CM

## 2015-02-22 LAB — MDC_IDC_ENUM_SESS_TYPE_INCLINIC
Battery Impedance: 100 Ohm
Battery Voltage: 2.79 V
Brady Statistic AP VP Percent: 8 %
Brady Statistic AP VS Percent: 0 %
Brady Statistic AS VS Percent: 0 %
Date Time Interrogation Session: 20160315153344
Lead Channel Pacing Threshold Amplitude: 0.5 V
Lead Channel Pacing Threshold Amplitude: 0.75 V
Lead Channel Pacing Threshold Pulse Width: 0.4 ms
Lead Channel Pacing Threshold Pulse Width: 0.4 ms
Lead Channel Setting Pacing Amplitude: 2.5 V
Lead Channel Setting Pacing Pulse Width: 0.4 ms
Lead Channel Setting Sensing Sensitivity: 2.8 mV
MDC IDC MSMT BATTERY REMAINING LONGEVITY: 136 mo
MDC IDC MSMT LEADCHNL RA IMPEDANCE VALUE: 437 Ohm
MDC IDC MSMT LEADCHNL RA SENSING INTR AMPL: 5.6 mV — AB
MDC IDC MSMT LEADCHNL RV IMPEDANCE VALUE: 578 Ohm
MDC IDC SET LEADCHNL RA PACING AMPLITUDE: 2 V
MDC IDC STAT BRADY AS VP PERCENT: 92 %

## 2015-02-22 NOTE — Assessment & Plan Note (Signed)
His Medtronic DDD PM is working normally. Will recheck in several months. 

## 2015-02-22 NOTE — Assessment & Plan Note (Signed)
He has had no recurrent symptoms. Will follow. 

## 2015-02-22 NOTE — Progress Notes (Signed)
      HPI [name] returns today for follow up. He is a pleasant 39 yo man with a h/o congenital complete heart block, s/p PPM insertion. He denies chest pain or sob or peripheral edema. He remains active and exercises regularly.  No Known Allergies   Current Outpatient Prescriptions  Medication Sig Dispense Refill  . cyclobenzaprine (FLEXERIL) 5 MG tablet Take 2 tablets (10 mg total) by mouth 2 (two) times daily as needed for muscle spasms. 20 tablet 0  . hydrOXYzine (ATARAX/VISTARIL) 25 MG tablet Take 1 tablet (25 mg total) by mouth 3 (three) times daily as needed for itching. 30 tablet 0  . ibuprofen (ADVIL,MOTRIN) 200 MG tablet Take 600 mg by mouth every 6 (six) hours as needed for moderate pain.    . naproxen sodium (ANAPROX) 220 MG tablet Take 440 mg by mouth 2 (two) times daily as needed (for pain).     No current facility-administered medications for this visit.     Past Medical History  Diagnosis Date  . H/O congenital heart block   . Syncope   . Mobitz type 2 second degree AV block   . Complete heart block   . Congenital heart block     ROS:   All systems reviewed and negative except as noted in the HPI.   Past Surgical History  Procedure Laterality Date  . Permanent pacemaker insertion N/A 10/19/2013    Procedure: PERMANENT PACEMAKER INSERTION;  Surgeon: Marinus MawGregg W Richardo Popoff, MD;  [facility];  Service: Cardiovascular;  Laterality: N/A;     No family history on file.   History   Social History  . Marital Status: Single    Spouse Name: N/A  . Number of Children: N/A  . Years of Education: N/A   Occupational History  . Not on file.   Social History Main Topics  . Smoking status: Light Tobacco Smoker -- 0.05 packs/day    Types: Cigarettes  . Smokeless tobacco: Not on file     Comment: VERY LIGHT, OCCASIONAL SMOKER  . Alcohol Use: 0.0 oz/week    0 Standard drinks or equivalent per week  . Drug Use: No  . Sexual Activity: Not on file    Other Topics Concern  . Not on file   Social History Narrative     BP 120/82 mmHg  Pulse 80  Ht 5' 8.5" (1.74 m)  Wt 210 lb 12.8 oz (95.618 kg)  BMI 31.58 kg/m2  Physical Exam:  Well appearing young man, NAD HEENT: Unremarkable Neck:  No JVD, no thyromegally Back:  No CVA tenderness Lungs:  Clear with no wheezes, well healed PPM incision HEART:  Regular rate rhythm, no murmurs, no rubs, no clicks Abd:  soft, positive bowel sounds, no organomegally, no rebound, no guarding Ext:  2 plus pulses, no edema, no cyanosis, no clubbing Skin:  No rashes no nodules Neuro:  CN II through XII intact, motor grossly intact  DEVICE  Normal device function.  See PaceArt for details.   Assess/Plan:

## 2015-02-22 NOTE — Patient Instructions (Signed)

## 2015-05-24 ENCOUNTER — Telehealth: Payer: Self-pay | Admitting: Cardiology

## 2015-05-24 ENCOUNTER — Ambulatory Visit (INDEPENDENT_AMBULATORY_CARE_PROVIDER_SITE_OTHER): Payer: Self-pay | Admitting: *Deleted

## 2015-05-24 DIAGNOSIS — I442 Atrioventricular block, complete: Secondary | ICD-10-CM

## 2015-05-24 NOTE — Progress Notes (Signed)
Remote pacemaker transmission.   

## 2015-05-24 NOTE — Telephone Encounter (Signed)
Spoke with pt and reminded pt of remote transmission that is due today. Pt verbalized understanding.   

## 2015-05-29 LAB — CUP PACEART REMOTE DEVICE CHECK
Battery Impedance: 112 Ohm
Battery Remaining Longevity: 133 mo
Battery Voltage: 2.79 V
Brady Statistic AP VP Percent: 9 %
Date Time Interrogation Session: 20160614162543
Lead Channel Impedance Value: 431 Ohm
Lead Channel Impedance Value: 589 Ohm
MDC IDC MSMT LEADCHNL RA PACING THRESHOLD AMPLITUDE: 0.625 V
MDC IDC MSMT LEADCHNL RA PACING THRESHOLD PULSEWIDTH: 0.4 ms
MDC IDC MSMT LEADCHNL RA SENSING INTR AMPL: 2.8 mV
MDC IDC MSMT LEADCHNL RV PACING THRESHOLD AMPLITUDE: 0.75 V
MDC IDC MSMT LEADCHNL RV PACING THRESHOLD PULSEWIDTH: 0.4 ms
MDC IDC SET LEADCHNL RA PACING AMPLITUDE: 2 V
MDC IDC SET LEADCHNL RV PACING AMPLITUDE: 2.5 V
MDC IDC SET LEADCHNL RV PACING PULSEWIDTH: 0.4 ms
MDC IDC SET LEADCHNL RV SENSING SENSITIVITY: 2.8 mV
MDC IDC STAT BRADY AP VS PERCENT: 0 %
MDC IDC STAT BRADY AS VP PERCENT: 91 %
MDC IDC STAT BRADY AS VS PERCENT: 0 %

## 2015-06-01 ENCOUNTER — Encounter: Payer: Self-pay | Admitting: Cardiology

## 2015-06-03 ENCOUNTER — Encounter: Payer: Self-pay | Admitting: Internal Medicine

## 2015-08-23 ENCOUNTER — Ambulatory Visit (INDEPENDENT_AMBULATORY_CARE_PROVIDER_SITE_OTHER): Payer: Self-pay | Admitting: *Deleted

## 2015-08-23 DIAGNOSIS — I442 Atrioventricular block, complete: Secondary | ICD-10-CM

## 2015-08-23 NOTE — Progress Notes (Signed)
Remote pacemaker transmission.   

## 2015-09-01 LAB — CUP PACEART REMOTE DEVICE CHECK
Battery Remaining Longevity: 133 mo
Battery Voltage: 2.79 V
Brady Statistic AS VS Percent: 0 %
Date Time Interrogation Session: 20160913133745
Lead Channel Impedance Value: 589 Ohm
Lead Channel Pacing Threshold Amplitude: 0.75 V
Lead Channel Pacing Threshold Pulse Width: 0.4 ms
Lead Channel Pacing Threshold Pulse Width: 0.4 ms
Lead Channel Sensing Intrinsic Amplitude: 2.8 mV
Lead Channel Setting Pacing Pulse Width: 0.4 ms
MDC IDC MSMT BATTERY IMPEDANCE: 112 Ohm
MDC IDC MSMT LEADCHNL RA IMPEDANCE VALUE: 443 Ohm
MDC IDC MSMT LEADCHNL RA PACING THRESHOLD AMPLITUDE: 0.5 V
MDC IDC SET LEADCHNL RA PACING AMPLITUDE: 2 V
MDC IDC SET LEADCHNL RV PACING AMPLITUDE: 2.5 V
MDC IDC SET LEADCHNL RV SENSING SENSITIVITY: 2.8 mV
MDC IDC STAT BRADY AP VP PERCENT: 11 %
MDC IDC STAT BRADY AP VS PERCENT: 0 %
MDC IDC STAT BRADY AS VP PERCENT: 89 %

## 2015-09-15 IMAGING — CR DG CHEST 1V PORT
1 series · 1 of 1 positions shown · non-contrast
Comparison: None available

CLINICAL DATA: Chest pain

EXAM:
PORTABLE CHEST - 1 VIEW

[AP]
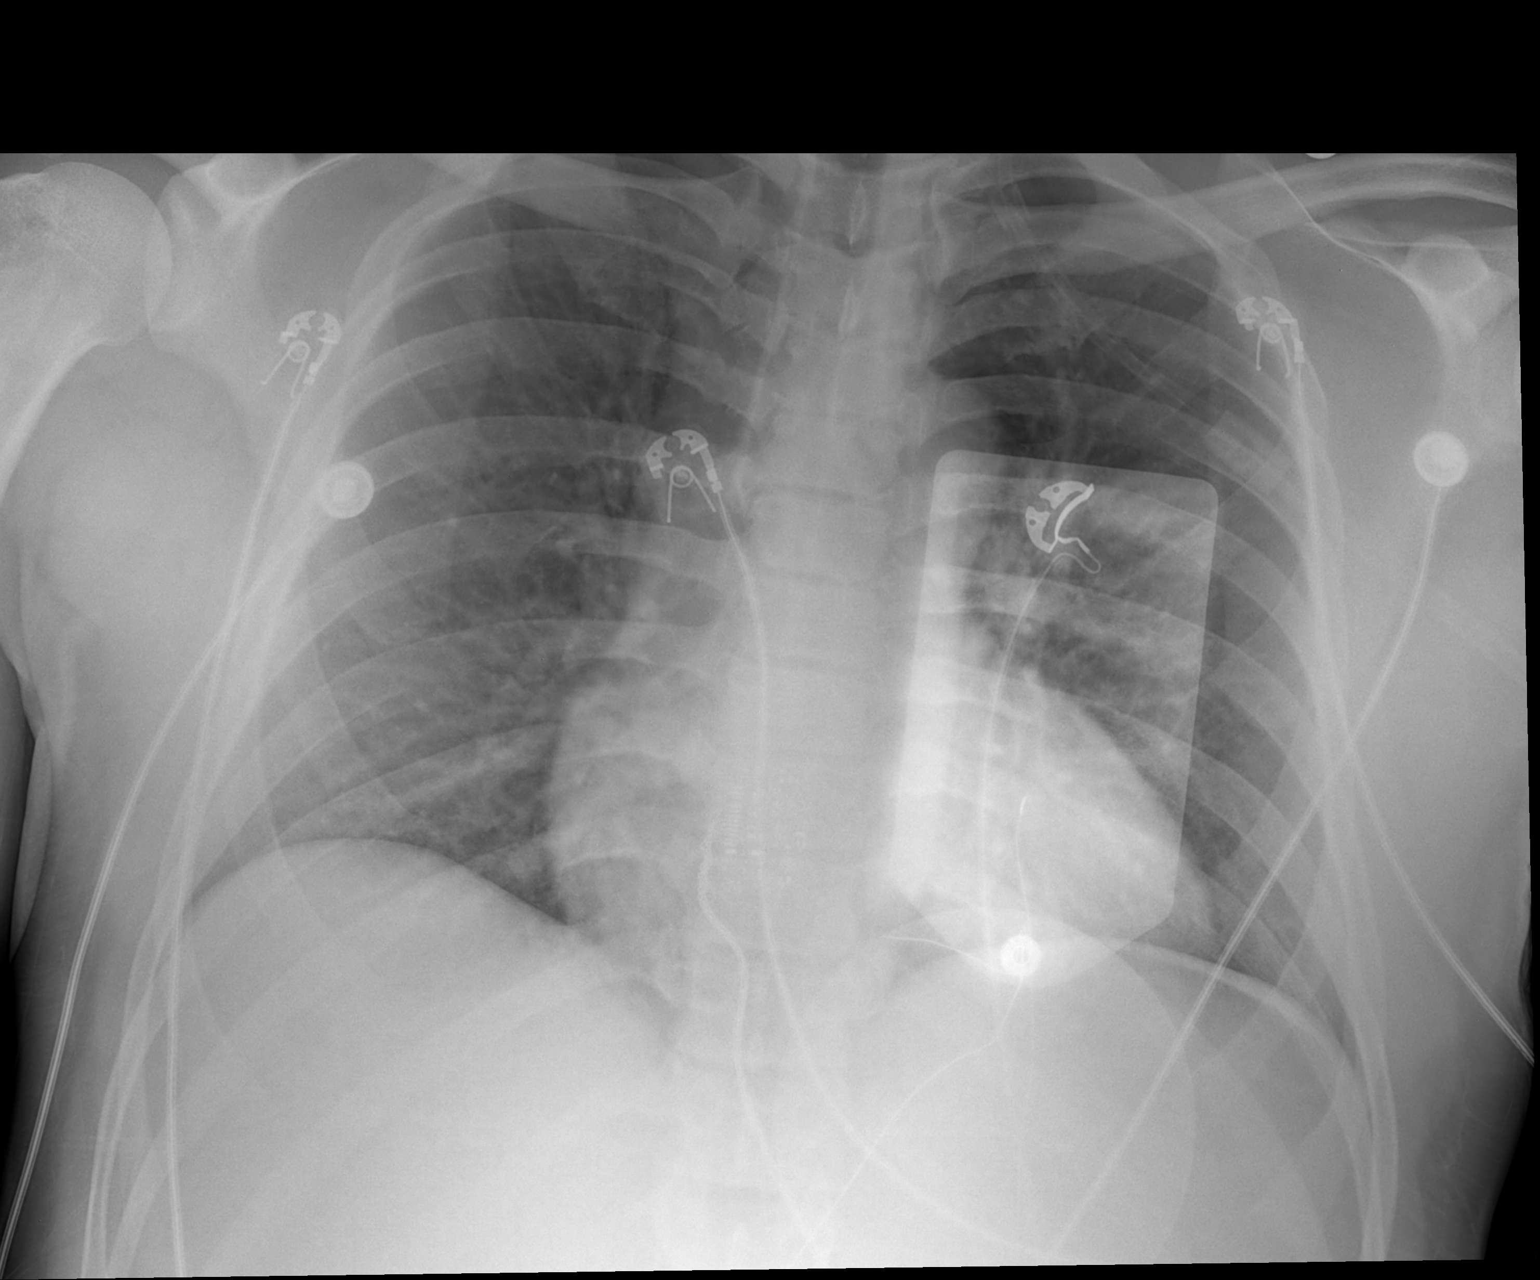

[1 of 1 positions shown; findings below may reference images not displayed]

FINDINGS: Defibrillator pads overlie the left hemi thorax. Transverse heart
size is within normal limits.

Lungs are normally inflated. There is mild indistinctness of the
pulmonary vasculature diffusely without pulmonary edema. No focal
infiltrate. No pneumothorax.

Osseous structures are within normal limits.
IMPRESSION: Mild pulmonary vascular congestion without overt pulmonary edema or
focal infiltrate.

## 2015-09-16 ENCOUNTER — Encounter: Payer: Self-pay | Admitting: Cardiology

## 2015-09-28 ENCOUNTER — Encounter: Payer: Self-pay | Admitting: Internal Medicine

## 2015-11-22 ENCOUNTER — Ambulatory Visit (INDEPENDENT_AMBULATORY_CARE_PROVIDER_SITE_OTHER): Payer: Self-pay | Admitting: *Deleted

## 2015-11-22 ENCOUNTER — Telehealth: Payer: Self-pay | Admitting: Cardiology

## 2015-11-22 DIAGNOSIS — I442 Atrioventricular block, complete: Secondary | ICD-10-CM

## 2015-11-22 NOTE — Telephone Encounter (Signed)
Spoke with pt and reminded pt of remote transmission that is due today. Pt verbalized understanding.   

## 2015-11-23 NOTE — Progress Notes (Signed)
Remote pacemaker transmission.   

## 2015-11-26 LAB — CUP PACEART REMOTE DEVICE CHECK
Battery Impedance: 112 Ohm
Battery Remaining Longevity: 131 mo
Brady Statistic AP VP Percent: 11 %
Date Time Interrogation Session: 20161213210911
Implantable Lead Implant Date: 20141110
Implantable Lead Location: 753859
Implantable Lead Location: 753860
Implantable Lead Model: 5076
Implantable Lead Model: 5076
Lead Channel Impedance Value: 408 Ohm
Lead Channel Pacing Threshold Amplitude: 0.75 V
Lead Channel Sensing Intrinsic Amplitude: 2.8 mV
Lead Channel Setting Pacing Amplitude: 2 V
Lead Channel Setting Pacing Pulse Width: 0.4 ms
MDC IDC LEAD IMPLANT DT: 20141110
MDC IDC MSMT BATTERY VOLTAGE: 2.79 V
MDC IDC MSMT LEADCHNL RA PACING THRESHOLD AMPLITUDE: 0.625 V
MDC IDC MSMT LEADCHNL RA PACING THRESHOLD PULSEWIDTH: 0.4 ms
MDC IDC MSMT LEADCHNL RV IMPEDANCE VALUE: 573 Ohm
MDC IDC MSMT LEADCHNL RV PACING THRESHOLD PULSEWIDTH: 0.4 ms
MDC IDC SET LEADCHNL RV PACING AMPLITUDE: 2.5 V
MDC IDC SET LEADCHNL RV SENSING SENSITIVITY: 4 mV
MDC IDC STAT BRADY AP VS PERCENT: 0 %
MDC IDC STAT BRADY AS VP PERCENT: 89 %
MDC IDC STAT BRADY AS VS PERCENT: 0 %

## 2015-11-30 ENCOUNTER — Encounter: Payer: Self-pay | Admitting: Cardiology

## 2016-02-27 ENCOUNTER — Encounter: Payer: Self-pay | Admitting: *Deleted

## 2016-02-27 ENCOUNTER — Telehealth: Payer: Self-pay | Admitting: *Deleted

## 2016-02-27 NOTE — Telephone Encounter (Signed)
Called patient to get medical history, family medical history and status.

## 2016-03-01 ENCOUNTER — Encounter: Payer: Self-pay | Admitting: Internal Medicine

## 2016-03-01 ENCOUNTER — Ambulatory Visit (INDEPENDENT_AMBULATORY_CARE_PROVIDER_SITE_OTHER): Payer: Self-pay | Admitting: Internal Medicine

## 2016-03-01 VITALS — BP 148/110 | HR 75 | Ht 68.5 in | Wt 213.8 lb

## 2016-03-01 DIAGNOSIS — I442 Atrioventricular block, complete: Secondary | ICD-10-CM

## 2016-03-01 LAB — CUP PACEART INCLINIC DEVICE CHECK
Battery Voltage: 2.79 V
Brady Statistic AP VS Percent: 0 %
Brady Statistic AS VS Percent: 0 %
Implantable Lead Implant Date: 20141110
Implantable Lead Location: 753859
Implantable Lead Location: 753860
Implantable Lead Model: 5076
Lead Channel Impedance Value: 413 Ohm
Lead Channel Impedance Value: 576 Ohm
Lead Channel Pacing Threshold Amplitude: 0.5 V
Lead Channel Pacing Threshold Pulse Width: 0.4 ms
Lead Channel Pacing Threshold Pulse Width: 0.4 ms
Lead Channel Pacing Threshold Pulse Width: 0.4 ms
Lead Channel Sensing Intrinsic Amplitude: 4 mV
Lead Channel Setting Pacing Pulse Width: 0.4 ms
MDC IDC LEAD IMPLANT DT: 20141110
MDC IDC MSMT BATTERY IMPEDANCE: 135 Ohm
MDC IDC MSMT BATTERY REMAINING LONGEVITY: 125 mo
MDC IDC MSMT LEADCHNL RA PACING THRESHOLD AMPLITUDE: 0.5 V
MDC IDC MSMT LEADCHNL RV PACING THRESHOLD AMPLITUDE: 0.5 V
MDC IDC SESS DTM: 20170323134141
MDC IDC SET LEADCHNL RA PACING AMPLITUDE: 2 V
MDC IDC SET LEADCHNL RV PACING AMPLITUDE: 2.5 V
MDC IDC SET LEADCHNL RV SENSING SENSITIVITY: 4 mV
MDC IDC STAT BRADY AP VP PERCENT: 10 %
MDC IDC STAT BRADY AS VP PERCENT: 90 %

## 2016-03-01 NOTE — Progress Notes (Signed)
      HPI Jonathan Logan returns today for follow up. He is a pleasant 40 yo man with a h/o congenital complete heart block, s/p PPM insertion. He denies chest pain or sob or peripheral edema. He remains active and exercises regularly.  No Known Allergies   Current Outpatient Prescriptions  Medication Sig Dispense Refill  . cyclobenzaprine (FLEXERIL) 5 MG tablet Take 2 tablets (10 mg total) by mouth 2 (two) times daily as needed for muscle spasms. 20 tablet 0  . hydrOXYzine (ATARAX/VISTARIL) 25 MG tablet Take 1 tablet (25 mg total) by mouth 3 (three) times daily as needed for itching. 30 tablet 0  . ibuprofen (ADVIL,MOTRIN) 200 MG tablet Take 600 mg by mouth every 6 (six) hours as needed for moderate pain.    . naproxen sodium (ANAPROX) 220 MG tablet Take 440 mg by mouth 2 (two) times daily as needed (for pain).     No current facility-administered medications for this visit.     Past Medical History  Diagnosis Date  . H/O congenital heart block   . Syncope   . Mobitz type 2 second degree AV block   . Complete heart block (HCC)   . Congenital heart block     ROS:   All systems reviewed and negative except as noted in the HPI.   Past Surgical History  Procedure Laterality Date  . Permanent pacemaker insertion N/A 10/19/2013    Procedure: PERMANENT PACEMAKER INSERTION;  Surgeon: Marinus MawGregg W Taylor, MD;  [facility];  Service: Cardiovascular;  Laterality: N/A;     No family history on file.   Social History   Social History  . Marital Status: Single    Spouse Name: N/A  . Number of Children: N/A  . Years of Education: N/A   Occupational History  . Not on file.   Social History Main Topics  . Smoking status: Light Tobacco Smoker -- 0.05 packs/day    Types: Cigarettes  . Smokeless tobacco: Not on file     Comment: VERY LIGHT, OCCASIONAL SMOKER  . Alcohol Use: 0.0 oz/week    0 Standard drinks or equivalent per week  . Drug Use: No  . Sexual Activity: Not on  file   Other Topics Concern  . Not on file   Social History Narrative     BP 148/110 mmHg  Pulse 75  Ht 5' 8.5" (1.74 m)  Wt 213 lb 12.8 oz (96.979 kg)  BMI 32.03 kg/m2  Physical Exam:  Well appearing young man, NAD HEENT: Unremarkable Neck:  No JVD, no thyromegally Back:  No CVA tenderness Lungs:  Clear with no wheezes, well healed PPM incision, well healed PM incision. HEART:  Regular rate rhythm, no murmurs, no rubs, no clicks Abd:  soft, positive bowel sounds, no organomegally, no rebound, no guarding Ext:  2 plus pulses, no edema, no cyanosis, no clubbing Skin:  No rashes no nodules Neuro:  CN II through XII intact, motor grossly intact  ECG - NSR with ventricular pacing  DEVICE  Normal device function.  See PaceArt for details.   Assess/Plan: 1. CHB - he is stable, s/p PPM insertion 2. PPM - his Medtronic DDD PM is working normally. Will recheck in several months. 3. HTN - He is encouraged to reduce his sodium intake.   Leonia ReevesGregg Taylor,M.D.

## 2016-03-01 NOTE — Patient Instructions (Signed)
Medication Instructions:  Your physician recommends that you continue on your current medications as directed. Please refer to the Current Medication list given to you today.   Labwork: None ordered   Testing/Procedures: None ordered   Follow-Up: Your physician wants you to follow-up in: 12 months with Dr Taylor You will receive a reminder letter in the mail two months in advance. If you don't receive a letter, please call our office to schedule the follow-up appointment.  Remote monitoring is used to monitor your Pacemaker from home. This monitoring reduces the number of office visits required to check your device to one time per year. It allows us to keep an eye on the functioning of your device to ensure it is working properly. You are scheduled for a device check from home on 05/31/16. You may send your transmission at any time that day. If you have a wireless device, the transmission will be sent automatically. After your physician reviews your transmission, you will receive a postcard with your next transmission date.     Any Other Special Instructions Will Be Listed Below (If Applicable).     If you need a refill on your cardiac medications before your next appointment, please call your pharmacy.   

## 2016-04-03 ENCOUNTER — Encounter (HOSPITAL_COMMUNITY): Payer: Self-pay | Admitting: Emergency Medicine

## 2016-04-03 ENCOUNTER — Emergency Department (HOSPITAL_COMMUNITY)
Admission: EM | Admit: 2016-04-03 | Discharge: 2016-04-03 | Disposition: A | Discharge status: Home / Self Care | Payer: Self-pay | Attending: Emergency Medicine | Admitting: Emergency Medicine

## 2016-04-03 DIAGNOSIS — Z950 Presence of cardiac pacemaker: Secondary | ICD-10-CM | POA: Insufficient documentation

## 2016-04-03 DIAGNOSIS — H6123 Impacted cerumen, bilateral: Secondary | ICD-10-CM | POA: Insufficient documentation

## 2016-04-03 DIAGNOSIS — Q246 Congenital heart block: Secondary | ICD-10-CM | POA: Insufficient documentation

## 2016-04-03 DIAGNOSIS — F17210 Nicotine dependence, cigarettes, uncomplicated: Secondary | ICD-10-CM | POA: Insufficient documentation

## 2016-04-03 DIAGNOSIS — H6121 Impacted cerumen, right ear: Secondary | ICD-10-CM

## 2016-04-03 MED ORDER — CARBAMIDE PEROXIDE 6.5 % OT SOLN: 5.0000 [drp] | Freq: Two times a day (BID) | OTIC | Status: DC

## 2016-04-03 MED ORDER — NAPROXEN 250 MG PO TABS: 250.0000 mg | ORAL_TABLET | Freq: Two times a day (BID) | ORAL | Status: DC

## 2016-04-03 NOTE — ED Provider Notes (Signed)
CSN: [id number]     Arrival date & time 04/03/16  1121 History  By signing my name below, I, Freida Busmaniana Omoyeni, attest that this documentation has been prepared under the direction and in the presence of non-physician practitioner, Everlene FarrierWilliam Georgean Spainhower, PA-C. Electronically Signed: Freida Busmaniana Omoyeni, Scribe. 04/03/2016. 2:01 PM.    Chief Complaint  Patient presents with  . Otalgia   The history is provided by the patient. No language interpreter was used.    HPI Comments:  Jonathan Logan is a 40 y.o. male who presents to the Emergency Department complaining of decreased hearing in the right ear with associated pressure that began after cleaning his ear with a Qtip yesterday. He denies nasal congestion, drainage from the right ear, pain/pressure in the left ear, cough, fever and postnasal drip. No alleviating factors noted.   Past Medical History  Diagnosis Date  . H/O congenital heart block   . Syncope   . Mobitz type 2 second degree AV block   . Complete heart block (HCC)   . Congenital heart block    Past Surgical History  Procedure Laterality Date  . Permanent pacemaker insertion N/A 10/19/2013    Procedure: PERMANENT PACEMAKER INSERTION;  Surgeon: Marinus MawGregg W Taylor, MD;  [facility];  Service: Cardiovascular;  Laterality: N/A;   No family history on file. Social History  Substance Use Topics  . Smoking status: Light Tobacco Smoker -- 0.05 packs/day    Types: Cigarettes  . Smokeless tobacco: None     Comment: VERY LIGHT, OCCASIONAL SMOKER  . Alcohol Use: 0.0 oz/week    0 Standard drinks or equivalent per week    Review of Systems  Constitutional: Negative for fever and chills.  HENT: Positive for hearing loss. Negative for congestion, ear discharge and postnasal drip.   Eyes: Negative for visual disturbance.  Respiratory: Negative for cough and shortness of breath.   Cardiovascular: Negative for chest pain.  Skin: Negative for rash.  Neurological: Negative for dizziness  and light-headedness.   Allergies  Review of patient's allergies indicates no known allergies.  Home Medications   Prior to Admission medications   Medication Sig Start Date End Date Taking? Authorizing Provider  ibuprofen (ADVIL,MOTRIN) 200 MG tablet Take 600 mg by mouth every 6 (six) hours as needed for moderate pain.   Yes Historical Provider, MD  carbamide peroxide (DEBROX) 6.5 % otic solution Place 5 drops into both ears 2 (two) times daily. 04/03/16   Everlene FarrierWilliam Vallery Mcdade, PA-C  naproxen (NAPROSYN) 250 MG tablet Take 1 tablet (250 mg total) by mouth 2 (two) times daily with a meal. 04/03/16   Everlene FarrierWilliam Davaughn Hillyard, PA-C   BP 143/85 mmHg  Pulse 75  Temp(Src) 98.1 F (36.7 C) (Oral)  Resp 14  Ht 5' 8.5" (1.74 m)  Wt 95.255 kg  BMI 31.46 kg/m2  SpO2 99% Physical Exam  Constitutional: He is oriented to person, place, and time. He appears well-developed and well-nourished. No distress.  Nontoxic appearing.  HENT:  Head: Normocephalic and atraumatic.  Right Ear: External ear normal.  Left Ear: External ear normal.  Mouth/Throat: Oropharynx is clear and moist.  Cerumen occlusion on the left, unable to visualize left TM Cerumen impaction on the right, unable to visualize right TM. No EAC edema. [facility]. Hearing decreased on right side.   Eyes: Conjunctivae are normal. Pupils are equal, round, and reactive to light. Right eye exhibits no discharge. Left eye exhibits no discharge.  Neck: Normal range of motion.  Neck supple.  Cardiovascular: Normal rate, regular rhythm, normal heart sounds and intact distal pulses.   Pulmonary/Chest: Effort normal and breath sounds normal. No respiratory distress. He has no wheezes. He has no rales.  Abdominal: Soft. He exhibits no distension. There is no tenderness.  Lymphadenopathy:    He has no cervical adenopathy.  Neurological: He is alert and oriented to person, place, and time. Coordination normal.  Skin: Skin is warm and dry. No rash  noted. He is not diaphoretic. No erythema. No pallor.  Psychiatric: He has a normal mood and affect. His behavior is normal.  Nursing note and vitals reviewed.   ED Course  Procedures  DIAGNOSTIC STUDIES:  Oxygen Saturation is 99% on RA, normal by my interpretation.    COORDINATION OF CARE:  1:40 PM Discussed treatment plan with pt at bedside and pt agreed to plan.  MDM   Meds given in ED:  Medications - No data to display  New Prescriptions   CARBAMIDE PEROXIDE (DEBROX) 6.5 % OTIC SOLUTION    Place 5 drops into both ears 2 (two) times daily.   NAPROXEN (NAPROSYN) 250 MG TABLET    Take 1 tablet (250 mg total) by mouth 2 (two) times daily with a meal.     Final diagnoses:  Cerumen impaction, right   Patient presents with cerumen impaction of the right ear. No concern for acute mastoiditis, meningitis. Pt afebrile in NAD. Patient discharged home with Debrox drop and naproxen. Will attempt ear irrigation by nursing by patient request.  Advised patient to follow-up with ENT if persistent symptoms  and to stop use of Qtips.  Patient expresses understanding and agrees with plan. Pt appears safe for discharge. I advised the patient to follow-up with their primary care provider this week. I advised the patient to return to the emergency department with new or worsening symptoms or new concerns. The patient verbalized understanding and agreement with plan.    I personally performed the services described in this documentation, which was scribed in my presence. The recorded information has been reviewed and is accurate.        Everlene Farrier, PA-C 04/03/16 1401  Jerelyn Scott, MD 04/03/16 [phone]

## 2016-04-03 NOTE — ED Notes (Signed)
Patient presents for right ear pain and "can't hear out of my right ear" x1 day. Denies drainage, fever, jaw or HA pain. No foreign body noted, redness or drainage noted.

## 2016-04-03 NOTE — Discharge Instructions (Signed)
Cerumen Impaction The structures of the external ear canal secrete a waxy substance known as cerumen. Excess cerumen can build up in the ear canal, causing a condition known as cerumen impaction. Cerumen impaction can cause ear pain and disrupt the function of the ear. The rate of cerumen production differs for each individual. In certain individuals, the configuration of the ear canal may decrease his or her ability to naturally remove cerumen. CAUSES Cerumen impaction is caused by excessive cerumen production or buildup. RISK FACTORS  Frequent use of swabs to clean ears.  Having narrow ear canals.  Having eczema.  Being dehydrated. SIGNS AND SYMPTOMS  Diminished hearing.  Ear drainage.  Ear pain.  Ear itch. TREATMENT Treatment may involve:  Over-the-counter or prescription ear drops to soften the cerumen.  Removal of cerumen by a health care provider. This may be done with:  Irrigation with warm water. This is the most common method of removal.  Ear curettes and other instruments.  Surgery. This may be done in severe cases. HOME CARE INSTRUCTIONS  Take medicines only as directed by your health care provider.  Do not insert objects into the ear with the intent of cleaning the ear. PREVENTION  Do not insert objects into the ear, even with the intent of cleaning the ear. Removing cerumen as a part of normal hygiene is not necessary, and the use of swabs in the ear canal is not recommended.  Drink enough water to keep your urine clear or pale yellow.  Control your eczema if you have it. SEEK MEDICAL CARE IF:  You develop ear pain.  You develop bleeding from the ear.  The cerumen does not clear after you use ear drops as directed.   This information is not intended to replace advice given to you by your health care provider. Make sure you discuss any questions you have with your health care provider.   Document Released: 01/03/2005 Document Revised: 12/17/2014  Document Reviewed: 07/13/2015 Elsevier Interactive Patient Education 2016 Elsevier Inc.  

## 2016-05-31 ENCOUNTER — Telehealth: Payer: Self-pay | Admitting: Cardiology

## 2016-05-31 ENCOUNTER — Encounter: Payer: Self-pay | Admitting: *Deleted

## 2016-05-31 NOTE — Telephone Encounter (Signed)
Attempted to confirm remote transmission with pt. No answer and was unable to leave a message.   

## 2016-06-01 ENCOUNTER — Encounter: Payer: Self-pay | Admitting: Cardiology

## 2016-06-11 ENCOUNTER — Ambulatory Visit (INDEPENDENT_AMBULATORY_CARE_PROVIDER_SITE_OTHER): Payer: Self-pay | Admitting: *Deleted

## 2016-06-11 DIAGNOSIS — I442 Atrioventricular block, complete: Secondary | ICD-10-CM

## 2016-06-11 DIAGNOSIS — Z950 Presence of cardiac pacemaker: Secondary | ICD-10-CM

## 2016-06-11 NOTE — Progress Notes (Signed)
Remote pacemaker transmission.   

## 2016-06-13 LAB — CUP PACEART REMOTE DEVICE CHECK
Battery Remaining Longevity: 121 mo
Brady Statistic AP VP Percent: 6 %
Implantable Lead Implant Date: 20141110
Implantable Lead Implant Date: 20141110
Implantable Lead Location: 753859
Lead Channel Impedance Value: 419 Ohm
Lead Channel Impedance Value: 588 Ohm
Lead Channel Sensing Intrinsic Amplitude: 2.8 mV
Lead Channel Setting Pacing Amplitude: 2.5 V
Lead Channel Setting Pacing Pulse Width: 0.4 ms
MDC IDC LEAD LOCATION: 753860
MDC IDC MSMT BATTERY IMPEDANCE: 160 Ohm
MDC IDC MSMT BATTERY VOLTAGE: 2.79 V
MDC IDC MSMT LEADCHNL RA PACING THRESHOLD AMPLITUDE: 0.5 V
MDC IDC MSMT LEADCHNL RA PACING THRESHOLD PULSEWIDTH: 0.4 ms
MDC IDC MSMT LEADCHNL RV PACING THRESHOLD AMPLITUDE: 0.625 V
MDC IDC MSMT LEADCHNL RV PACING THRESHOLD PULSEWIDTH: 0.4 ms
MDC IDC SESS DTM: 20170702194119
MDC IDC SET LEADCHNL RA PACING AMPLITUDE: 2 V
MDC IDC SET LEADCHNL RV SENSING SENSITIVITY: 4 mV
MDC IDC STAT BRADY AP VS PERCENT: 0 %
MDC IDC STAT BRADY AS VP PERCENT: 94 %
MDC IDC STAT BRADY AS VS PERCENT: 0 %

## 2016-06-15 ENCOUNTER — Encounter: Payer: Self-pay | Admitting: Cardiology

## 2016-09-10 ENCOUNTER — Telehealth: Payer: Self-pay | Admitting: Cardiology

## 2016-09-10 ENCOUNTER — Encounter: Payer: Self-pay | Admitting: *Deleted

## 2016-09-10 NOTE — Telephone Encounter (Signed)
Spoke with pt and reminded pt of remote transmission that is due today. Pt verbalized understanding.   

## 2016-09-14 ENCOUNTER — Encounter: Payer: Self-pay | Admitting: Cardiology

## 2016-09-17 ENCOUNTER — Ambulatory Visit (INDEPENDENT_AMBULATORY_CARE_PROVIDER_SITE_OTHER): Payer: Self-pay | Admitting: *Deleted

## 2016-09-17 DIAGNOSIS — I442 Atrioventricular block, complete: Secondary | ICD-10-CM

## 2016-09-17 NOTE — Progress Notes (Signed)
Remote pacemaker transmission.   

## 2016-09-18 ENCOUNTER — Encounter: Payer: Self-pay | Admitting: Cardiology

## 2016-09-19 LAB — CUP PACEART REMOTE DEVICE CHECK
Battery Impedance: 136 Ohm
Battery Voltage: 2.8 V
Brady Statistic AP VP Percent: 6 %
Brady Statistic AS VP Percent: 94 %
Date Time Interrogation Session: 20171008051814
Implantable Lead Location: 753859
Implantable Lead Model: 5076
Implantable Lead Model: 5076
Lead Channel Impedance Value: 419 Ohm
Lead Channel Pacing Threshold Amplitude: 0.5 V
Lead Channel Setting Pacing Amplitude: 2.5 V
Lead Channel Setting Pacing Pulse Width: 0.4 ms
MDC IDC LEAD IMPLANT DT: 20141110
MDC IDC LEAD IMPLANT DT: 20141110
MDC IDC LEAD LOCATION: 753860
MDC IDC MSMT BATTERY REMAINING LONGEVITY: 126 mo
MDC IDC MSMT LEADCHNL RA PACING THRESHOLD PULSEWIDTH: 0.4 ms
MDC IDC MSMT LEADCHNL RV IMPEDANCE VALUE: 588 Ohm
MDC IDC MSMT LEADCHNL RV PACING THRESHOLD AMPLITUDE: 0.625 V
MDC IDC MSMT LEADCHNL RV PACING THRESHOLD PULSEWIDTH: 0.4 ms
MDC IDC SET LEADCHNL RA PACING AMPLITUDE: 2 V
MDC IDC SET LEADCHNL RV SENSING SENSITIVITY: 4 mV
MDC IDC STAT BRADY AP VS PERCENT: 0 %
MDC IDC STAT BRADY AS VS PERCENT: 0 %

## 2016-12-17 ENCOUNTER — Telehealth: Payer: Self-pay | Admitting: Cardiology

## 2016-12-17 ENCOUNTER — Encounter: Payer: Self-pay | Admitting: *Deleted

## 2016-12-17 NOTE — Telephone Encounter (Signed)
Spoke with pt and reminded pt of remote transmission that is due today. Pt verbalized understanding and said he would do it this weekend.

## 2016-12-21 ENCOUNTER — Encounter: Payer: Self-pay | Admitting: Cardiology

## 2016-12-31 ENCOUNTER — Ambulatory Visit (INDEPENDENT_AMBULATORY_CARE_PROVIDER_SITE_OTHER): Payer: Self-pay | Admitting: *Deleted

## 2016-12-31 DIAGNOSIS — I442 Atrioventricular block, complete: Secondary | ICD-10-CM

## 2017-01-01 LAB — CUP PACEART REMOTE DEVICE CHECK
Battery Impedance: 159 Ohm
Brady Statistic AP VP Percent: 6 %
Brady Statistic AS VP Percent: 94 %
Brady Statistic AS VS Percent: 0 %
Implantable Lead Implant Date: 20141110
Implantable Lead Location: 753859
Implantable Lead Model: 5076
Implantable Lead Model: 5076
Lead Channel Impedance Value: 408 Ohm
Lead Channel Impedance Value: 550 Ohm
Lead Channel Pacing Threshold Amplitude: 0.5 V
Lead Channel Pacing Threshold Amplitude: 0.625 V
Lead Channel Pacing Threshold Pulse Width: 0.4 ms
Lead Channel Setting Pacing Amplitude: 2.5 V
MDC IDC LEAD IMPLANT DT: 20141110
MDC IDC LEAD LOCATION: 753860
MDC IDC MSMT BATTERY REMAINING LONGEVITY: 119 mo
MDC IDC MSMT BATTERY VOLTAGE: 2.8 V
MDC IDC MSMT LEADCHNL RV PACING THRESHOLD PULSEWIDTH: 0.4 ms
MDC IDC PG IMPLANT DT: 20141110
MDC IDC SESS DTM: 20180121163337
MDC IDC SET LEADCHNL RA PACING AMPLITUDE: 2 V
MDC IDC SET LEADCHNL RV PACING PULSEWIDTH: 0.4 ms
MDC IDC SET LEADCHNL RV SENSING SENSITIVITY: 4 mV
MDC IDC STAT BRADY AP VS PERCENT: 0 %

## 2017-01-01 NOTE — Progress Notes (Signed)
Remote pacemaker transmission.   

## 2017-01-02 ENCOUNTER — Encounter: Payer: Self-pay | Admitting: Cardiology

## 2017-03-05 ENCOUNTER — Encounter: Payer: Self-pay | Admitting: Internal Medicine

## 2017-03-05 ENCOUNTER — Encounter (INDEPENDENT_AMBULATORY_CARE_PROVIDER_SITE_OTHER): Payer: Self-pay

## 2017-03-05 ENCOUNTER — Ambulatory Visit (INDEPENDENT_AMBULATORY_CARE_PROVIDER_SITE_OTHER): Payer: PRIVATE HEALTH INSURANCE | Admitting: Internal Medicine

## 2017-03-05 VITALS — BP 140/98 | HR 84 | Ht 68.5 in | Wt 218.0 lb

## 2017-03-05 DIAGNOSIS — Z45018 Encounter for adjustment and management of other part of cardiac pacemaker: Secondary | ICD-10-CM

## 2017-03-05 DIAGNOSIS — I442 Atrioventricular block, complete: Secondary | ICD-10-CM

## 2017-03-05 MED ORDER — SILDENAFIL CITRATE 20 MG PO TABS: ORAL_TABLET | ORAL | 3 refills | Status: DC

## 2017-03-05 MED ORDER — AMLODIPINE BESYLATE 5 MG PO TABS: 5.0000 mg | ORAL_TABLET | Freq: Every day | ORAL | 3 refills | Status: DC

## 2017-03-05 NOTE — Progress Notes (Signed)
      HPI [name] returns today for follow up. He is a pleasant 41 yo man with a h/o congenital complete heart block, s/p PPM insertion. He denies chest pain or sob or peripheral edema. He remains active and exercises regularly. He has had some problems with ED. 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 No Known Allergies   Current Outpatient Prescriptions  Medication Sig Dispense Refill  . carbamide peroxide (DEBROX) 6.5 % otic solution Place 5 drops into both ears 2 (two) times daily. 15 mL 1  . ibuprofen (ADVIL,MOTRIN) 200 MG tablet Take 600 mg by mouth every 6 (six) hours as needed for moderate pain.     No current facility-administered medications for this visit.      Past Medical History:  Diagnosis Date  . Complete heart block (HCC)   . Congenital heart block   . H/O congenital heart block   . Mobitz type 2 second degree AV block   . Syncope     ROS:   All systems reviewed and negative except as noted in the HPI.   Past Surgical History:  Procedure Laterality Date  . PERMANENT PACEMAKER INSERTION N/A 10/19/2013   Procedure: PERMANENT PACEMAKER INSERTION;  Surgeon: Marinus MawGregg W Madalaine Portier, MD;  [facility];  Service: Cardiovascular;  Laterality: N/A;     No family history on file.   Social History   Social History  . Marital status: Single    Spouse name: N/A  . Number of children: N/A  . Years of education: N/A   Occupational History  . Not on file.   Social History Main Topics  . Smoking status: Light Tobacco Smoker    Packs/day: 0.05    Types: Cigarettes  . Smokeless tobacco: Not on file     Comment: VERY LIGHT, OCCASIONAL SMOKER  . Alcohol use 0.0 oz/week  . Drug use: No  . Sexual activity: Not on file   Other Topics Concern  . Not on file   Social History Narrative  . No narrative on file     BP (!) 140/98   Pulse 84   Ht 5' 8.5" (1.74 m)   Wt 218 lb (98.9 kg)   BMI 32.66 kg/m   Physical Exam:  Well appearing  young man, NAD HEENT: Unremarkable Neck:  No JVD, no thyromegally Back:  No CVA tenderness Lungs:  Clear with no wheezes, well healed PPM incision, well healed PM incision. HEART:  Regular rate rhythm, no murmurs, no rubs, no clicks Abd:  soft, positive bowel sounds, no organomegally, no rebound, no guarding Ext:  2 plus pulses, no edema, no cyanosis, no clubbing Skin:  No rashes no nodules Neuro:  CN II through XII intact, motor grossly intact  ECG - NSR with ventricular pacing  DEVICE  Normal device function.  See PaceArt for details.   Assess/Plan: 1. CHB - he is stable, s/p PPM insertion 2. PPM - his Medtronic DDD PM is working normally. Will recheck in several months. 3. HTN - He is encouraged to reduce his sodium intake. He will start amlodipine. 4. ED - this is a new problem. Will start sildanefil  Leonia ReevesGregg Cheng Dec,M.D.

## 2017-03-05 NOTE — Patient Instructions (Addendum)
Medication Instructions:   Your physician has recommended you make the following change in your medication:  1) START Amlodipine (Norvasc) 5 mg once daily 2) Sildenafil 20 mg -- take 2-3 tablets daily as needed   --- If you need a refill on your cardiac medications before your next appointment, please call your pharmacy. ---  Labwork:  None ordered  Testing/Procedures:  None ordered  Follow-Up:  Your physician wants you to follow-up in: 1 year with Dr. Ladona Ridgel.  You will receive a reminder letter in the mail two months in advance. If you don't receive a letter, please call our office to schedule the follow-up appointment.  Thank you for choosing [facility]!!    Any Other Special Instructions Will Be Listed Below (If Applicable).  Amlodipine tablets What is this medicine? AMLODIPINE (am LOE di peen) is a calcium-channel blocker. It affects the amount of calcium found in your heart and muscle cells. This relaxes your blood vessels, which can reduce the amount of work the heart has to do. This medicine is used to lower high blood pressure. It is also used to prevent chest pain. This medicine may be used for other purposes; ask your health care provider or pharmacist if you have questions. COMMON BRAND NAME(S): Norvasc What should I tell my health care provider before I take this medicine? They need to know if you have any of these conditions: -heart problems like heart failure or aortic stenosis -liver disease -an unusual or allergic reaction to amlodipine, other medicines, foods, dyes, or preservatives -pregnant or trying to get pregnant -breast-feeding How should I use this medicine? Take this medicine by mouth with a glass of water. Follow the directions on the prescription label. Take your medicine at regular intervals. Do not take more medicine than directed. Talk to your pediatrician regarding the use of this medicine in children. Special care may be needed. This  medicine has been used in children as young as 6. Persons over 40 years old may have a stronger reaction to this medicine and need smaller doses. Overdosage: If you think you have taken too much of this medicine contact a poison control center or emergency room at once. NOTE: This medicine is only for you. Do not share this medicine with others. What if I miss a dose? If you miss a dose, take it as soon as you can. If it is almost time for your next dose, take only that dose. Do not take double or extra doses. What may interact with this medicine? -herbal or dietary supplements -local or general anesthetics -medicines for high blood pressure -medicines for prostate problems -rifampin This list may not describe all possible interactions. Give your health care provider a list of all the medicines, herbs, non-prescription drugs, or dietary supplements you use. Also tell them if you smoke, drink alcohol, or use illegal drugs. Some items may interact with your medicine. What should I watch for while using this medicine? Visit your doctor or health care professional for regular check ups. Check your blood pressure and pulse rate regularly. Ask your health care professional what your blood pressure and pulse rate should be, and when you should contact him or her. This medicine may make you feel confused, dizzy or lightheaded. Do not drive, use machinery, or do anything that needs mental alertness until you know how this medicine affects you. To reduce the risk of dizzy or fainting spells, do not sit or stand up quickly, especially if you are an older patient.  Avoid alcoholic drinks; they can make you more dizzy. Do not suddenly stop taking amlodipine. Ask your doctor or health care professional how you can gradually reduce the dose. What side effects may I notice from receiving this medicine? Side effects that you should report to your doctor or health care professional as soon as possible: -allergic  reactions like skin rash, itching or hives, swelling of the face, lips, or tongue -breathing problems -changes in vision or hearing -chest pain -fast, irregular heartbeat -swelling of legs or ankles Side effects that usually do not require medical attention (report to your doctor or health care professional if they continue or are bothersome): -dry mouth -facial flushing -nausea, vomiting -stomach gas, pain -tired, weak -trouble sleeping This list may not describe all possible side effects. Call your doctor for medical advice about side effects. You may report side effects to FDA at 1-800-FDA-1088. Where should I keep my medicine? Keep out of the reach of children. Store at room temperature between 59 and 86 degrees F (15 and 30 degrees C). Protect from light. Keep container tightly closed. Throw away any unused medicine after the expiration date. NOTE: This sheet is a summary. It may not cover all possible information. If you have questions about this medicine, talk to your doctor, pharmacist, or health care provider.  2018 Elsevier/Gold Standard (2012-10-24 11:40:58)

## 2017-03-06 LAB — CUP PACEART INCLINIC DEVICE CHECK
Implantable Lead Implant Date: 20141110
Implantable Lead Location: 753859
Implantable Lead Model: 5076
Implantable Lead Model: 5076
Lead Channel Setting Pacing Amplitude: 2 V
Lead Channel Setting Pacing Amplitude: 2.5 V
Lead Channel Setting Pacing Pulse Width: 0.4 ms
Lead Channel Setting Sensing Sensitivity: 4 mV
MDC IDC LEAD IMPLANT DT: 20141110
MDC IDC LEAD LOCATION: 753860
MDC IDC PG IMPLANT DT: 20141110
MDC IDC SESS DTM: 20180328082822

## 2017-04-05 ENCOUNTER — Telehealth: Payer: Self-pay | Admitting: Internal Medicine

## 2017-04-05 NOTE — Telephone Encounter (Signed)
°  New Prob   Has some questions regarding Amlodipine and Sildenafil. Please call.

## 2017-04-05 NOTE — Telephone Encounter (Signed)
Returned call-states patient tried to get amlodipine and sildenafil filled and were instructed to call the nurse back if it was too expensive.  Advised they tried to get it filled and they are unable to afford.  Advised I would route to primary to see what alternative options were discussed when they spoke to her.    Verbalized understanding.

## 2017-04-08 MED ORDER — SILDENAFIL CITRATE 20 MG PO TABS: ORAL_TABLET | ORAL | 3 refills | Status: DC

## 2017-04-08 NOTE — Telephone Encounter (Signed)
Informed pt that Amlodipine is on the $4 list and is one of cheapest blood pressure medication. He understands and will fill. He also reports sildenafil too expensive at local pharmacy.  Instructed will send to [facility] which offers 20 mg tablets for $2 per tablet.  Sent in for 20 tablets per pt request. Patient verbalized understanding and agreeable to plan.

## 2017-06-04 ENCOUNTER — Encounter: Payer: PRIVATE HEALTH INSURANCE | Admitting: *Deleted

## 2017-06-04 ENCOUNTER — Telehealth: Payer: Self-pay | Admitting: Cardiology

## 2017-06-04 NOTE — Telephone Encounter (Signed)
Attempted to confirm remote transmission with pt. No answer and was unable to leave a message.   

## 2017-06-07 ENCOUNTER — Encounter: Payer: Self-pay | Admitting: Cardiology

## 2017-06-14 ENCOUNTER — Ambulatory Visit (INDEPENDENT_AMBULATORY_CARE_PROVIDER_SITE_OTHER): Payer: PRIVATE HEALTH INSURANCE | Admitting: *Deleted

## 2017-06-14 DIAGNOSIS — I442 Atrioventricular block, complete: Secondary | ICD-10-CM

## 2017-06-14 DIAGNOSIS — Z950 Presence of cardiac pacemaker: Secondary | ICD-10-CM

## 2017-06-20 NOTE — Progress Notes (Signed)
Remote pacemaker transmission.   

## 2017-06-21 ENCOUNTER — Encounter: Payer: Self-pay | Admitting: Cardiology

## 2017-07-09 LAB — CUP PACEART REMOTE DEVICE CHECK
Battery Remaining Longevity: 121 mo
Battery Voltage: 2.79 V
Date Time Interrogation Session: 20180706184610
Implantable Lead Location: 753859
Implantable Pulse Generator Implant Date: 20141110
Lead Channel Impedance Value: 413 Ohm
Lead Channel Pacing Threshold Pulse Width: 0.4 ms
Lead Channel Setting Pacing Amplitude: 2.5 V
Lead Channel Setting Pacing Pulse Width: 0.4 ms
Lead Channel Setting Sensing Sensitivity: 4 mV
MDC IDC LEAD IMPLANT DT: 20141110
MDC IDC LEAD IMPLANT DT: 20141110
MDC IDC LEAD LOCATION: 753860
MDC IDC MSMT BATTERY IMPEDANCE: 160 Ohm
MDC IDC MSMT LEADCHNL RA PACING THRESHOLD AMPLITUDE: 0.5 V
MDC IDC MSMT LEADCHNL RA PACING THRESHOLD PULSEWIDTH: 0.4 ms
MDC IDC MSMT LEADCHNL RA SENSING INTR AMPL: 2.8 mV
MDC IDC MSMT LEADCHNL RV IMPEDANCE VALUE: 588 Ohm
MDC IDC MSMT LEADCHNL RV PACING THRESHOLD AMPLITUDE: 0.5 V
MDC IDC SET LEADCHNL RA PACING AMPLITUDE: 2 V
MDC IDC STAT BRADY AP VP PERCENT: 9 %
MDC IDC STAT BRADY AP VS PERCENT: 0 %
MDC IDC STAT BRADY AS VP PERCENT: 91 %
MDC IDC STAT BRADY AS VS PERCENT: 0 %

## 2017-07-25 ENCOUNTER — Ambulatory Visit: Payer: PRIVATE HEALTH INSURANCE | Admitting: Adult Health

## 2017-07-29 ENCOUNTER — Encounter: Payer: Self-pay | Admitting: Adult Health

## 2017-07-29 ENCOUNTER — Ambulatory Visit (INDEPENDENT_AMBULATORY_CARE_PROVIDER_SITE_OTHER): Payer: PRIVATE HEALTH INSURANCE | Admitting: Adult Health

## 2017-07-29 VITALS — BP 133/75 | HR 80 | Ht 68.5 in | Wt 207.3 lb

## 2017-07-29 DIAGNOSIS — I10 Essential (primary) hypertension: Secondary | ICD-10-CM | POA: Diagnosis not present

## 2017-07-29 DIAGNOSIS — I442 Atrioventricular block, complete: Secondary | ICD-10-CM | POA: Diagnosis not present

## 2017-07-29 DIAGNOSIS — Z23 Encounter for immunization: Secondary | ICD-10-CM | POA: Diagnosis not present

## 2017-07-29 DIAGNOSIS — M5440 Lumbago with sciatica, unspecified side: Secondary | ICD-10-CM | POA: Insufficient documentation

## 2017-07-29 DIAGNOSIS — Z0000 Encounter for general adult medical examination without abnormal findings: Secondary | ICD-10-CM

## 2017-07-29 MED ORDER — AMLODIPINE BESYLATE 5 MG PO TABS: 5.0000 mg | ORAL_TABLET | Freq: Every day | ORAL | 2 refills | Status: DC

## 2017-07-29 NOTE — Assessment & Plan Note (Signed)
Re-started on Amlodipine 5mg  daily. Last dose was weeks ago-he ran out. Continue heart healthy diet and regular exercise.

## 2017-07-29 NOTE — Assessment & Plan Note (Signed)
None during initial OV today.

## 2017-07-29 NOTE — Progress Notes (Signed)
Subjective:    Patient ID: Jonathan Logan, male    DOB: 1975-12-14, 41 y.o.   MRN: [id number]  HPI:  [name] is here to establish as a new pt.  He is a very pleasant 41 year old male.  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 Alric Quan, last OV 02/2017.  He was started on Amlodipine 5mg , however he has not taken it in weeks due to running out of medication.  He denies CP/dyspnea/dizziness/palpitations/extreme fatigue.  He estimates to drink >gallon water/day, follows heart healthy diet and exercises several days a week-wt training and cardio.   He denies tobacco use and consumes EOTH only socially. He works FT as Producer, television/film/video" at [employer] and feels "pretty well" in regards to his overall health.  Patient Care Team    Relationship Specialty Notifications Start End  Julaine Fusi, NP PCP - General Family Medicine  07/25/17     Patient Active Problem List   Diagnosis Date Noted  . HTN, goal below 140/90 07/29/2017  . Health care maintenance 07/29/2017  . Back pain of lumbar region with sciatica 07/29/2017  . Pacemaker 02/19/2014  . Complete heart block (HCC) 10/18/2013  . Syncope 10/18/2013  . Congenital heart block 10/18/2013     Past Medical History:  Diagnosis Date  . Complete heart block (HCC)   . Congenital heart block   . H/O congenital heart block   . Mobitz type 2 second degree AV block   . Syncope      Past Surgical History:  Procedure Laterality Date  . PERMANENT PACEMAKER INSERTION N/A 10/19/2013   Procedure: PERMANENT PACEMAKER INSERTION;  Surgeon: Marinus Maw, MD;  Location: [facility] CATH LAB;  Service: Cardiovascular;  Laterality: N/A;     Family History  Problem Relation Age of Onset  . Diabetes Mother   . Hypertension Mother   . Healthy Sister   . Healthy Brother   . Healthy Brother   . Healthy Brother   . Healthy Daughter   . Healthy Son   .  Healthy Daughter   . Premature birth Daughter   . Healthy Son   . Healthy Son   . Healthy Son      History  Drug Use No     History  Alcohol Use  . 0.0 oz/week    Comment: occassionally     History  Smoking Status  . Former Smoker  . Packs/day: 0.05  . Types: Cigarettes  . Quit date: 12/10/2013  Smokeless Tobacco  . Never Used    Comment: VERY LIGHT, OCCASIONAL SMOKER     Outpatient Encounter Prescriptions as of 07/29/2017  Medication Sig  . ibuprofen (ADVIL,MOTRIN) 200 MG tablet Take 600 mg by mouth every 6 (six) hours as needed for moderate pain.  Marland Kitchen amLODipine (NORVASC) 5 MG tablet Take 1 tablet (5 mg total) by mouth daily.  . [DISCONTINUED] amLODipine (NORVASC) 5 MG tablet Take 1 tablet (5 mg total) by mouth daily. (Patient not taking: Reported on 07/29/2017)  . [DISCONTINUED] carbamide peroxide (DEBROX) 6.5 % otic solution Place 5 drops into both ears 2 (two) times daily.  . [DISCONTINUED] sildenafil (REVATIO) 20 MG tablet Take 2-3 tablets daily as needed   No facility-administered encounter medications on file as of 07/29/2017.     Allergies: Patient has no known allergies.  Body mass index is 31.06 kg/m.  Blood pressure 133/75, pulse 80, height 5'  8.5" (1.74 m), weight 207 lb 4.8 oz (94 kg).      Review of Systems  Constitutional: Positive for fatigue. Negative for activity change, appetite change, chills, diaphoresis and fever.  HENT: Negative for congestion.   Eyes: Negative for visual disturbance.  Respiratory: Negative for cough, chest tightness, shortness of breath, wheezing and stridor.   Cardiovascular: Negative for chest pain, palpitations and leg swelling.  Gastrointestinal: Negative for abdominal distention, abdominal pain, blood in stool, constipation, diarrhea, nausea and vomiting.  Endocrine: Negative for cold intolerance, heat intolerance, polydipsia, polyphagia and polyuria.  Genitourinary: Negative for difficulty urinating, flank pain and  hematuria.  Musculoskeletal: Positive for arthralgias, back pain and myalgias. Negative for gait problem, joint swelling, neck pain and neck stiffness.  Skin: Negative for color change, pallor, rash and wound.  Neurological: Negative for dizziness, tremors, weakness and headaches.  Hematological: Does not bruise/bleed easily.  Psychiatric/Behavioral: Negative for dysphoric mood, self-injury, sleep disturbance and suicidal ideas. The patient is not nervous/anxious.        Objective:   Physical Exam  Constitutional: He is oriented to person, place, and time. He appears well-developed and well-nourished. No distress.  HENT:  Head: Normocephalic and atraumatic.  Right Ear: Hearing, tympanic membrane, external ear and ear canal normal. Tympanic membrane is not erythematous and not bulging. No decreased hearing is noted.  Left Ear: Hearing, tympanic membrane, external ear and ear canal normal. Tympanic membrane is not erythematous and not bulging. No decreased hearing is noted.  Nose: Nose normal. Right sinus exhibits no maxillary sinus tenderness and no frontal sinus tenderness. Left sinus exhibits no maxillary sinus tenderness and no frontal sinus tenderness.  Mouth/Throat: Uvula is midline, oropharynx is clear and moist and mucous membranes are normal.  Eyes: Pupils are equal, round, and reactive to light. Conjunctivae are normal.  Neck: Normal range of motion. Neck supple.  Cardiovascular: Normal rate, regular rhythm, normal heart sounds and intact distal pulses.   No murmur heard. PPM palpated on upper L chest wall  Pulmonary/Chest: Effort normal and breath sounds normal. No respiratory distress. He has no wheezes. He has no rales. He exhibits no tenderness.  Musculoskeletal: Normal range of motion.  Lymphadenopathy:    He has no cervical adenopathy.  Neurological: He is alert and oriented to person, place, and time. Coordination normal.  Skin: Skin is warm and dry. No rash noted. He is  not diaphoretic. No erythema. No pallor.  Psychiatric: He has a normal mood and affect. His behavior is normal. Judgment and thought content normal.  Nursing note and vitals reviewed.         Assessment & Plan:   1. Need for Tdap vaccination   2. HTN, goal below 140/90   3. Complete heart block (HCC)   4. Health care maintenance   5. Back pain of lumbar region with sciatica     HTN, goal below 140/90 Re-started on Amlodipine 5mg  daily. Last dose was weeks ago-he ran out. Continue heart healthy diet and regular exercise.  Complete heart block PPM inserted 10/2013 He denies acute cardiac sx's and is followed by Dr. Alric Quan annually. Last OV 02/2017  Health care maintenance Continue excellent hydration and hearth healthy diet. Continue regular exercise. Continue with cards as directed. Recommend CPE with fasting labs in next month.  Back pain of lumbar region with sciatica None during initial OV today.    FOLLOW-UP:  Return in about 4 weeks (around 08/26/2017) for CPE, Fasting Lab Draw.

## 2017-07-29 NOTE — Assessment & Plan Note (Signed)
PPM inserted 10/2013 He denies acute cardiac sx's and is followed by Dr. Alric Quan annually. Last OV 02/2017

## 2017-07-29 NOTE — Assessment & Plan Note (Signed)
Continue excellent hydration and hearth healthy diet. Continue regular exercise. Continue with cards as directed. Recommend CPE with fasting labs in next month.

## 2017-07-29 NOTE — Patient Instructions (Signed)
Heart-Healthy Eating Plan Many factors influence your heart health, including eating and exercise habits. Heart (coronary) risk increases with abnormal blood fat (lipid) levels. Heart-healthy meal planning includes limiting unhealthy fats, increasing healthy fats, and making other small dietary changes. This includes maintaining a healthy body weight to help keep lipid levels within a normal range. What is my plan? Your health care provider recommends that you:  Get no more than __25__% of the total calories in your daily diet from fat.  Limit your intake of saturated fat to less than ___5___% of your total calories each day.  Limit the amount of cholesterol in your diet to less than __300__ mg per day.  What types of fat should I choose?  Choose healthy fats more often. Choose monounsaturated and polyunsaturated fats, such as olive oil and canola oil, flaxseeds, walnuts, almonds, and seeds.  Eat more omega-3 fats. Good choices include salmon, mackerel, sardines, tuna, flaxseed oil, and ground flaxseeds. Aim to eat fish at least two times each week.  Limit saturated fats. Saturated fats are primarily found in animal products, such as meats, butter, and cream. Plant sources of saturated fats include palm oil, palm kernel oil, and coconut oil.  Avoid foods with partially hydrogenated oils in them. These contain trans fats. Examples of foods that contain trans fats are stick margarine, some tub margarines, cookies, crackers, and other baked goods. What general guidelines do I need to follow?  Check food labels carefully to identify foods with trans fats or high amounts of saturated fat.  Fill one half of your plate with vegetables and green salads. Eat 4-5 servings of vegetables per day. A serving of vegetables equals 1 cup of raw leafy vegetables,  cup of raw or cooked cut-up vegetables, or  cup of vegetable juice.  Fill one fourth of your plate with whole grains. Look for the word "whole"  as the first word in the ingredient list.  Fill one fourth of your plate with lean protein foods.  Eat 4-5 servings of fruit per day. A serving of fruit equals one medium whole fruit,  cup of dried fruit,  cup of fresh, frozen, or canned fruit, or  cup of 100% fruit juice.  Eat more foods that contain soluble fiber. Examples of foods that contain this type of fiber are apples, broccoli, carrots, beans, peas, and barley. Aim to get 20-30 g of fiber per day.  Eat more home-cooked food and less restaurant, buffet, and fast food.  Limit or avoid alcohol.  Limit foods that are high in starch and sugar.  Avoid fried foods.  Cook foods by using methods other than frying. Baking, boiling, grilling, and broiling are all great options. Other fat-reducing suggestions include: ? Removing the skin from poultry. ? Removing all visible fats from meats. ? Skimming the fat off of stews, soups, and gravies before serving them. ? Steaming vegetables in water or broth.  Lose weight if you are overweight. Losing just 5-10% of your initial body weight can help your overall health and prevent diseases such as diabetes and heart disease.  Increase your consumption of nuts, legumes, and seeds to 4-5 servings per week. One serving of dried beans or legumes equals  cup after being cooked, one serving of nuts equals 1 ounces, and one serving of seeds equals  ounce or 1 tablespoon.  You may need to monitor your salt (sodium) intake, especially if you have high blood pressure. Talk with your health care provider or dietitian to get  more information about reducing sodium. What foods can I eat? Grains  Breads, including [place], white, pita, wheat, raisin, rye, oatmeal, and [place]. Tortillas that are neither fried nor made with lard or trans fat. Low-fat rolls, including hotdog and hamburger buns and English muffins. Biscuits. Muffins. Waffles. Pancakes. Light popcorn. Whole-grain cereals. Flatbread. Melba  toast. Pretzels. Breadsticks. Rusks. Low-fat snacks and crackers, including oyster, saltine, matzo, graham, animal, and rye. Rice and pasta, including brown rice and those that are made with whole wheat. Vegetables All vegetables. Fruits All fruits, but limit coconut. Meats and Other Protein Sources Lean, well-trimmed beef, veal, pork, and lamb. Chicken and [place] without skin. All fish and shellfish. Wild duck, rabbit, pheasant, and venison. Egg whites or low-cholesterol egg substitutes. Dried beans, peas, lentils, and tofu.Seeds and most nuts. Dairy Low-fat or nonfat cheeses, including ricotta, string, and mozzarella. Skim or 1% milk that is liquid, powdered, or evaporated. Buttermilk that is made with low-fat milk. Nonfat or low-fat yogurt. Beverages Mineral water. Diet carbonated beverages. Sweets and Desserts Sherbets and fruit ices. Honey, jam, marmalade, jelly, and syrups. Meringues and gelatins. Pure sugar candy, such as hard candy, jelly beans, gumdrops, mints, marshmallows, and small amounts of dark chocolate. [employer] Eat all sweets and desserts in moderation. Fats and Oils Nonhydrogenated (trans-free) margarines. Vegetable oils, including soybean, sesame, sunflower, olive, peanut, safflower, corn, canola, and cottonseed. Salad dressings or mayonnaise that are made with a vegetable oil. Limit added fats and oils that you use for cooking, baking, salads, and as spreads. Other Cocoa powder. Coffee and tea. All seasonings and condiments. The items listed above may not be a complete list of recommended foods or beverages. Contact your dietitian for more options. What foods are not recommended? Grains Breads that are made with saturated or trans fats, oils, or whole milk. Croissants. Butter rolls. Cheese breads. Sweet rolls. Donuts. Buttered popcorn. Chow mein noodles. High-fat crackers, such as cheese or butter crackers. Meats and Other Protein Sources Fatty meats, such as  hotdogs, short ribs, sausage, spareribs, bacon, ribeye roast or steak, and mutton. High-fat deli meats, such as salami and bologna. Caviar. Domestic duck and goose. Organ meats, such as kidney, liver, sweetbreads, brains, gizzard, chitterlings, and heart. Dairy Cream, sour cream, cream cheese, and creamed cottage cheese. Whole milk cheeses, including blue (bleu), Monterey Jack, [place], [place], American, [place], Swiss, [place], [place], and [place]. Whole or 2% milk that is liquid, evaporated, or condensed. Whole buttermilk. Cream sauce or high-fat cheese sauce. Yogurt that is made from whole milk. Beverages Regular sodas and drinks with added sugar. Sweets and Desserts Frosting. Pudding. Cookies. Cakes other than angel food cake. Candy that has milk chocolate or white chocolate, hydrogenated fat, butter, coconut, or unknown ingredients. Buttered syrups. Full-fat ice cream or ice cream drinks. Fats and Oils Gravy that has suet, meat fat, or shortening. Cocoa butter, hydrogenated oils, palm oil, coconut oil, palm kernel oil. These can often be found in baked products, candy, fried foods, nondairy creamers, and whipped toppings. Solid fats and shortenings, including bacon fat, salt pork, lard, and butter. Nondairy cream substitutes, such as coffee creamers and sour cream substitutes. Salad dressings that are made of unknown oils, cheese, or sour cream. The items listed above may not be a complete list of foods and beverages to avoid. Contact your dietitian for more information. This information is not intended to replace advice given to you by your health care provider. Make sure you discuss any questions you have with your health care  provider. Document Released: 09/04/2008 Document Revised: 06/15/2016 Document Reviewed: 05/20/2014 Elsevier Interactive Patient Education  2017 [employer] Inc.   Third-Degree Atrioventricular Block What is atrioventricular (AV) block? Atrioventricular (AV) block, also  known as heart block, is a problem with the system that controls how often the heart beats (heart rate)and the pattern of heart beats (heart rhythm). In this condition, the signals that travel from the heart's upper chambers (atria) to its lower chambers (ventricles) move too slowly or are interrupted.There are several types of heart block:  First-degree.  Second-degree.  Third-degree or complete.  What is third-degree AV block?  Third-degree AV block, also called complete block, is the most serious type of heart block. In this condition, the signals that control heart rate are completely blocked. As a result, certain cells in the heart cause the ventricles to contract and pump blood (escape beats). These cells act as a sort of "backup system." However, this backup system works at a much slower rate than normal, and it is not enough to keep your heart working well. What are the causes? This condition may be caused by:  Any condition that damages the system that controls the heart's rate and rhythm, such as a heart attack.  Overstimulation of the nerve that slows down heart rate (vagus nerve). This cause is common among well-conditioned athletes.  Some medicinesthat slow down the heart rate, such as beta blockers or calcium channel blockers.  Surgery that damages the heart.  Some people are born with this condition (congenital heart block),but most people develop it over time. What increases the risk? The risk for this condition increases with age. You are also more likely to develop this condition if you have:  A history of heart attack.  Heart failure.  Coronary heart disease.  Inflammation of heart muscle (myocarditis).  Disease of heart muscle (cardiomyopathy).  Infection of the heart valves (endocarditis).  Infections or diseases that affect the heart. These include: ? Lyme disease. ? Sarcoidosis. ? Hemochromatosis. ? Rheumatic fever. ? Muscle disorders including  Lev disease and Lenegre disease.  Babies are more likely to be born with heart block if:  The mother has an autoimmune disease, such as lupus.  The baby is born with a heart defect that affects the heart's structure.  A parent was born with a heart defect.  What are the signs or symptoms? Symptoms of this condition include:  Tiredness.  Shortness of breath.  Dizziness.  Light-headedness.  Fainting.  Chest pain.  How is this diagnosed? This condition may be diagnosed based on:  A physical exam.  Your medical history.  A measurement of your pulse or heartbeat.  An electrocardiogram (ECG). This test is done to check for problems with electrical activity in the heart.  An electrophysiology (EP) study. This test involves having long, thin tubes (catheters) placed in the heart. The catheters are used to study the heart and record electrical signals in the heart.  How is this treated? This condition must be treated right away at a hospital right. Treatment may involve:  Treating an underlying condition, such as heart disease.  Changing or stopping any heart medicines that can cause heart block.  Having a permanent pacemaker placed in the chest. A pacemaker is a small device that uses electrical pulses to help the heart beat normally. It is usually placed under the skin on the chest or abdomen.  Follow these instructions at home: General instructions  Take over-the-counter and prescription medicines only as told by your  health care provider.  Work with your health care provider to control lifestyle choices that increase your risk for heart disease. You may need to: ? Get regular exercise. Each week, try to get 150 minutes of moderate-intensity activity (such as walking or yoga) or 75 minutes of vigorous activity (such as running or swimming). Ask your health care provider what type of exercise is safe for you. ? Eat a heart-healthy diet with fruits and vegetables, whole  grains, low-fat dairy products, and lean proteins like poultry and eggs. Your health care provider or diet and nutrition specialist (dietitian) can help you make healthy choices. ? Maintain a healthy weight. ? Limit alcohol intake to no more than 1 drink per day for nonpregnant women and 2 drinks per day for men. One drink equals 12 oz of beer, 5 oz of wine, or 1 oz of hard liquor.  Do not use any products that contain nicotine or tobacco, such as cigarettes and e-cigarettes. If you need help quitting, ask your health care provider.  Keep all follow-up visits as told by your health care provider. This is important. If you have a pacemaker:  Avoid spending long periods of time with electronic devices that have strong magnetic fields, such as cell phones, microwaves, or metal detectors.  Tell all health care providers who care for you that you have a pacemaker.  If you are flying, tell airport security that you have a pacemaker.  Consider wearing a medical ID bracelet or necklace stating that you have a pacemaker.  You will be given a card with information about your pacemaker. Carry this card with you at all times. Contact a health care provider if:  You feel like your heart is skipping beats.  You feel more tired than normal.  You have swelling in your lower legs or your feet. Get help right away if:  Your symptoms change or they get worse.  You develop new symptoms.  You have chest pain, especially if the pain: ? Feels like crushing or pressure. ? Spreads to your arms, back, neck, or jaw.  You feel short of breath.  You feel light-headed or weak.  You faint. Summary  Third-degree AV block, also called complete block, is the most serious type of heart block. In this condition, the signals that control heart rate are completely blocked.  A pacemaker is a small device that uses electrical pulses to help the heart beat normally. It is usually placed under the skin on your  chest or abdomen.  Third-degree heart block is a medical emergency, and it should be treated right away at a hospital. This information is not intended to replace advice given to you by your health care provider. Make sure you discuss any questions you have with your health care provider. Document Released: 11/08/2008 Document Revised: 07/13/2016 Document Reviewed: 07/13/2016 Elsevier Interactive Patient Education  2018 [employer].  Re-started on Amlodipine 5mg  daily. Continue your excellent water intake/heart healthy diet/regular exercise. Continue daily stretching and heating pad. Please schedule full physical with fasting labs in the next month. WELCOME TO THE PRACTICE!

## 2017-09-02 ENCOUNTER — Ambulatory Visit (INDEPENDENT_AMBULATORY_CARE_PROVIDER_SITE_OTHER): Payer: PRIVATE HEALTH INSURANCE | Admitting: Adult Health

## 2017-09-02 ENCOUNTER — Encounter: Payer: Self-pay | Admitting: Adult Health

## 2017-09-02 VITALS — BP 120/69 | HR 66 | Ht 68.5 in | Wt 207.1 lb

## 2017-09-02 DIAGNOSIS — Z0000 Encounter for general adult medical examination without abnormal findings: Secondary | ICD-10-CM

## 2017-09-02 DIAGNOSIS — M5440 Lumbago with sciatica, unspecified side: Secondary | ICD-10-CM

## 2017-09-02 DIAGNOSIS — I10 Essential (primary) hypertension: Secondary | ICD-10-CM | POA: Diagnosis not present

## 2017-09-02 MED ORDER — CYCLOBENZAPRINE HCL 10 MG PO TABS: 10.0000 mg | ORAL_TABLET | Freq: Three times a day (TID) | ORAL | 0 refills | Status: DC | PRN

## 2017-09-02 NOTE — Assessment & Plan Note (Signed)
Daily stretching Heating pad Epson Na++ bath soak Cyclobenzaprine  PRN

## 2017-09-02 NOTE — Patient Instructions (Signed)
Heart-Healthy Eating Plan Many factors influence your heart health, including eating and exercise habits. Heart (coronary) risk increases with abnormal blood fat (lipid) levels. Heart-healthy meal planning includes limiting unhealthy fats, increasing healthy fats, and making other small dietary changes. This includes maintaining a healthy body weight to help keep lipid levels within a normal range. What is my plan? Your health care provider recommends that you:  Get no more than ___25__% of the total calories in your daily diet from fat.  Limit your intake of saturated fat to less than ___5__% of your total calories each day.  Limit the amount of cholesterol in your diet to less than __300_ mg per day.  What types of fat should I choose?  Choose healthy fats more often. Choose monounsaturated and polyunsaturated fats, such as olive oil and canola oil, flaxseeds, walnuts, almonds, and seeds.  Eat more omega-3 fats. Good choices include salmon, mackerel, sardines, tuna, flaxseed oil, and ground flaxseeds. Aim to eat fish at least two times each week.  Limit saturated fats. Saturated fats are primarily found in animal products, such as meats, butter, and cream. Plant sources of saturated fats include palm oil, palm kernel oil, and coconut oil.  Avoid foods with partially hydrogenated oils in them. These contain trans fats. Examples of foods that contain trans fats are stick margarine, some tub margarines, cookies, crackers, and other baked goods. What general guidelines do I need to follow?  Check food labels carefully to identify foods with trans fats or high amounts of saturated fat.  Fill one half of your plate with vegetables and green salads. Eat 4-5 servings of vegetables per day. A serving of vegetables equals 1 cup of raw leafy vegetables,  cup of raw or cooked cut-up vegetables, or  cup of vegetable juice.  Fill one fourth of your plate with whole grains. Look for the word "whole"  as the first word in the ingredient list.  Fill one fourth of your plate with lean protein foods.  Eat 4-5 servings of fruit per day. A serving of fruit equals one medium whole fruit,  cup of dried fruit,  cup of fresh, frozen, or canned fruit, or  cup of 100% fruit juice.  Eat more foods that contain soluble fiber. Examples of foods that contain this type of fiber are apples, broccoli, carrots, beans, peas, and barley. Aim to get 20-30 g of fiber per day.  Eat more home-cooked food and less restaurant, buffet, and fast food.  Limit or avoid alcohol.  Limit foods that are high in starch and sugar.  Avoid fried foods.  Cook foods by using methods other than frying. Baking, boiling, grilling, and broiling are all great options. Other fat-reducing suggestions include: ? Removing the skin from poultry. ? Removing all visible fats from meats. ? Skimming the fat off of stews, soups, and gravies before serving them. ? Steaming vegetables in water or broth.  Lose weight if you are overweight. Losing just 5-10% of your initial body weight can help your overall health and prevent diseases such as diabetes and heart disease.  Increase your consumption of nuts, legumes, and seeds to 4-5 servings per week. One serving of dried beans or legumes equals  cup after being cooked, one serving of nuts equals 1 ounces, and one serving of seeds equals  ounce or 1 tablespoon.  You may need to monitor your salt (sodium) intake, especially if you have high blood pressure. Talk with your health care provider or dietitian to get  more information about reducing sodium. What foods can I eat? Grains  Breads, including [place], white, pita, wheat, raisin, rye, oatmeal, and [place]. Tortillas that are neither fried nor made with lard or trans fat. Low-fat rolls, including hotdog and hamburger buns and English muffins. Biscuits. Muffins. Waffles. Pancakes. Light popcorn. Whole-grain cereals. Flatbread. Melba  toast. Pretzels. Breadsticks. Rusks. Low-fat snacks and crackers, including oyster, saltine, matzo, graham, animal, and rye. Rice and pasta, including brown rice and those that are made with whole wheat. Vegetables All vegetables. Fruits All fruits, but limit coconut. Meats and Other Protein Sources Lean, well-trimmed beef, veal, pork, and lamb. Chicken and [place] without skin. All fish and shellfish. Wild duck, rabbit, pheasant, and venison. Egg whites or low-cholesterol egg substitutes. Dried beans, peas, lentils, and tofu.Seeds and most nuts. Dairy Low-fat or nonfat cheeses, including ricotta, string, and mozzarella. Skim or 1% milk that is liquid, powdered, or evaporated. Buttermilk that is made with low-fat milk. Nonfat or low-fat yogurt. Beverages Mineral water. Diet carbonated beverages. Sweets and Desserts Sherbets and fruit ices. Honey, jam, marmalade, jelly, and syrups. Meringues and gelatins. Pure sugar candy, such as hard candy, jelly beans, gumdrops, mints, marshmallows, and small amounts of dark chocolate. [employer] Eat all sweets and desserts in moderation. Fats and Oils Nonhydrogenated (trans-free) margarines. Vegetable oils, including soybean, sesame, sunflower, olive, peanut, safflower, corn, canola, and cottonseed. Salad dressings or mayonnaise that are made with a vegetable oil. Limit added fats and oils that you use for cooking, baking, salads, and as spreads. Other Cocoa powder. Coffee and tea. All seasonings and condiments. The items listed above may not be a complete list of recommended foods or beverages. Contact your dietitian for more options. What foods are not recommended? Grains Breads that are made with saturated or trans fats, oils, or whole milk. Croissants. Butter rolls. Cheese breads. Sweet rolls. Donuts. Buttered popcorn. Chow mein noodles. High-fat crackers, such as cheese or butter crackers. Meats and Other Protein Sources Fatty meats, such as  hotdogs, short ribs, sausage, spareribs, bacon, ribeye roast or steak, and mutton. High-fat deli meats, such as salami and bologna. Caviar. Domestic duck and goose. Organ meats, such as kidney, liver, sweetbreads, brains, gizzard, chitterlings, and heart. Dairy Cream, sour cream, cream cheese, and creamed cottage cheese. Whole milk cheeses, including blue (bleu), Monterey Jack, [place], [place], American, [place], Swiss, [place], [place], and [place]. Whole or 2% milk that is liquid, evaporated, or condensed. Whole buttermilk. Cream sauce or high-fat cheese sauce. Yogurt that is made from whole milk. Beverages Regular sodas and drinks with added sugar. Sweets and Desserts Frosting. Pudding. Cookies. Cakes other than angel food cake. Candy that has milk chocolate or white chocolate, hydrogenated fat, butter, coconut, or unknown ingredients. Buttered syrups. Full-fat ice cream or ice cream drinks. Fats and Oils Gravy that has suet, meat fat, or shortening. Cocoa butter, hydrogenated oils, palm oil, coconut oil, palm kernel oil. These can often be found in baked products, candy, fried foods, nondairy creamers, and whipped toppings. Solid fats and shortenings, including bacon fat, salt pork, lard, and butter. Nondairy cream substitutes, such as coffee creamers and sour cream substitutes. Salad dressings that are made of unknown oils, cheese, or sour cream. The items listed above may not be a complete list of foods and beverages to avoid. Contact your dietitian for more information. This information is not intended to replace advice given to you by your health care provider. Make sure you discuss any questions you have with your health care  provider. Document Released: 09/04/2008 Document Revised: 06/15/2016 Document Reviewed: 05/20/2014 Elsevier Interactive Patient Education  2017 [employer].   Back Exercises The following exercises strengthen the muscles that help to support the back. They also help to  keep the lower back flexible. Doing these exercises can help to prevent back pain or lessen existing pain. If you have back pain or discomfort, try doing these exercises 2-3 times each day or as told by your health care provider. When the pain goes away, do them once each day, but increase the number of times that you repeat the steps for each exercise (do more repetitions). If you do not have back pain or discomfort, do these exercises once each day or as told by your health care provider. Exercises Single Knee to Chest  Repeat these steps 3-5 times for each leg: 1. Lie on your back on a firm bed or the floor with your legs extended. 2. Bring one knee to your chest. Your other leg should stay extended and in contact with the floor. 3. Hold your knee in place by grabbing your knee or thigh. 4. Pull on your knee until you feel a gentle stretch in your lower back. 5. Hold the stretch for 10-30 seconds. 6. Slowly release and straighten your leg.  Pelvic Tilt  Repeat these steps 5-10 times: 1. Lie on your back on a firm bed or the floor with your legs extended. 2. Bend your knees so they are pointing toward the ceiling and your feet are flat on the floor. 3. Tighten your lower abdominal muscles to press your lower back against the floor. This motion will tilt your pelvis so your tailbone points up toward the ceiling instead of pointing to your feet or the floor. 4. With gentle tension and even breathing, hold this position for 5-10 seconds.  Cat-Cow  Repeat these steps until your lower back becomes more flexible: 1. Get into a hands-and-knees position on a firm surface. Keep your hands under your shoulders, and keep your knees under your hips. You may place padding under your knees for comfort. 2. Let your head hang down, and point your tailbone toward the floor so your lower back becomes rounded like the back of a cat. 3. Hold this position for 5 seconds. 4. Slowly lift your head and point  your tailbone up toward the ceiling so your back forms a sagging arch like the back of a cow. 5. Hold this position for 5 seconds.  Press-Ups  Repeat these steps 5-10 times: 1. Lie on your abdomen (face-down) on the floor. 2. Place your palms near your head, about shoulder-width apart. 3. While you keep your back as relaxed as possible and keep your hips on the floor, slowly straighten your arms to raise the top half of your body and lift your shoulders. Do not use your back muscles to raise your upper torso. You may adjust the placement of your hands to make yourself more comfortable. 4. Hold this position for 5 seconds while you keep your back relaxed. 5. Slowly return to lying flat on the floor.  Bridges  Repeat these steps 10 times: 1. Lie on your back on a firm surface. 2. Bend your knees so they are pointing toward the ceiling and your feet are flat on the floor. 3. Tighten your buttocks muscles and lift your buttocks off of the floor until your waist is at almost the same height as your knees. You should feel the muscles working in your  buttocks and the back of your thighs. If you do not feel these muscles, slide your feet 1-2 inches farther away from your buttocks. 4. Hold this position for 3-5 seconds. 5. Slowly lower your hips to the starting position, and allow your buttocks muscles to relax completely.  If this exercise is too easy, try doing it with your arms crossed over your chest. Abdominal Crunches  Repeat these steps 5-10 times: 1. Lie on your back on a firm bed or the floor with your legs extended. 2. Bend your knees so they are pointing toward the ceiling and your feet are flat on the floor. 3. Cross your arms over your chest. 4. Tip your chin slightly toward your chest without bending your neck. 5. Tighten your abdominal muscles and slowly raise your trunk (torso) high enough to lift your shoulder blades a tiny bit off of the floor. Avoid raising your torso higher  than that, because it can put too much stress on your low back and it does not help to strengthen your abdominal muscles. 6. Slowly return to your starting position.  Back Lifts Repeat these steps 5-10 times: 1. Lie on your abdomen (face-down) with your arms at your sides, and rest your forehead on the floor. 2. Tighten the muscles in your legs and your buttocks. 3. Slowly lift your chest off of the floor while you keep your hips pressed to the floor. Keep the back of your head in line with the curve in your back. Your eyes should be looking at the floor. 4. Hold this position for 3-5 seconds. 5. Slowly return to your starting position.  Contact a health care provider if:  Your back pain or discomfort gets much worse when you do an exercise.  Your back pain or discomfort does not lessen within 2 hours after you exercise. If you have any of these problems, stop doing these exercises right away. Do not do them again unless your health care provider says that you can. Get help right away if:  You develop sudden, severe back pain. If this happens, stop doing the exercises right away. Do not do them again unless your health care provider says that you can. This information is not intended to replace advice given to you by your health care provider. Make sure you discuss any questions you have with your health care provider. Document Released: 01/03/2005 Document Revised: 04/04/2016 Document Reviewed: 01/20/2015 Elsevier Interactive Patient Education  2017 [employer].  Please continue all medications as directed. Continue excellent water intake and heart healthy diet. We will call when lab results are available. Annual follow-up, sooner if needed. NICE TO SEE YOU!

## 2017-09-02 NOTE — Progress Notes (Signed)
Subjective:    Patient ID: Jonathan Logan, male    DOB: [birth year], 41 y.o.   MRN: [id number]  HPI 07/29/2017 OV note: Jonathan Logan is here to establish as a new pt.  He is a very pleasant 41 year old male.  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 Alric Quan, last OV 02/2017.  He was started on Amlodipine , however he has not taken it in weeks due to running out of medication.  He denies CP/dyspnea/dizziness/palpitations/extreme fatigue.  He estimates to drink >gallon water/day, follows heart healthy diet and exercises several days a week-wt training and cardio.   He denies tobacco use and consumes EOTH only socially. He works FT as Producer, television/film/video" at [employer] and feels "pretty well" in regards to his overall health.  Today's OV: Jonathan Logan is here for CPE.  PMH: HTN, PPM, and lumbago with sciatica.  He has been compliant with Amlodipine -BP at goal today. He continues to experience back pain, despite daily stretching and use of heating pad.  He works 6 days a week and exercises on regular basis (weights/cardio). He is followed by cards annually and denies acute cardiac sx's.  Patient Care Team    Relationship Specialty Notifications Start End  Julaine Fusi, NP PCP - General Family Medicine  07/25/17     Patient Active Problem List   Diagnosis Date Noted  . HTN, goal below 140/90 07/29/2017  . Health care maintenance 07/29/2017  . Back pain of lumbar region with sciatica 07/29/2017  . Pacemaker 02/19/2014  . Complete heart block (HCC) 10/18/2013  . Syncope 10/18/2013  . Congenital heart block 10/18/2013     Past Medical History:  Diagnosis Date  . Complete heart block (HCC)   . Congenital heart block   . H/O congenital heart block   . Mobitz type 2 second degree AV block   . Syncope      Past Surgical History:  Procedure Laterality Date  . PERMANENT PACEMAKER INSERTION  N/A 10/19/2013   Procedure: PERMANENT PACEMAKER INSERTION;  Surgeon: Marinus Maw, MD;  Location: [facility] CATH LAB;  Service: Cardiovascular;  Laterality: N/A;     Family History  Problem Relation Age of Onset  . Diabetes Mother   . Hypertension Mother   . Healthy Sister   . Healthy Brother   . Healthy Brother   . Healthy Brother   . Healthy Daughter   . Healthy Son   . Healthy Daughter   . Premature birth Daughter   . Healthy Son   . Healthy Son   . Healthy Son      History  Drug Use No     History  Alcohol Use  . 0.0 oz/week    Comment: occassionally     History  Smoking Status  . Former Smoker  . Packs/day: 0.05  . Types: Cigarettes  . Quit date: 12/10/2013  Smokeless Tobacco  . Never Used    Comment: VERY LIGHT, OCCASIONAL SMOKER     Outpatient Encounter Prescriptions as of 09/02/2017  Medication Sig  . amLODipine (NORVASC) 5 MG tablet Take 1 tablet (5 mg total) by mouth daily.  Marland Kitchen ibuprofen (ADVIL,MOTRIN) 200 MG tablet Take 600 mg by mouth every 6 (six) hours as needed for moderate pain.  . cyclobenzaprine (FLEXERIL) 10 MG tablet Take 1 tablet (10 mg total) by mouth 3 (three) times daily as needed for muscle spasms.  No facility-administered encounter medications on file as of 09/02/2017.     Allergies: Patient has no known allergies.  Body mass index is 31.03 kg/m.  Blood pressure 120/69, pulse 66, height 5' 8.5" (1.74 m), weight 207 lb 1.6 oz (93.9 kg).      Review of Systems  Constitutional: Positive for fatigue. Negative for activity change, appetite change, chills, diaphoresis and fever.  HENT: Negative for congestion.   Eyes: Negative for visual disturbance.  Respiratory: Negative for cough, chest tightness, shortness of breath, wheezing and stridor.   Cardiovascular: Negative for chest pain, palpitations and leg swelling.  Gastrointestinal: Negative for abdominal distention, abdominal pain, blood in stool, constipation, diarrhea, nausea  and vomiting.  Endocrine: Negative for cold intolerance, heat intolerance, polydipsia, polyphagia and polyuria.  Genitourinary: Negative for difficulty urinating, flank pain and hematuria.  Musculoskeletal: Positive for arthralgias, back pain and myalgias. Negative for gait problem, joint swelling, neck pain and neck stiffness.  Skin: Negative for color change, pallor, rash and wound.  Neurological: Negative for dizziness, tremors, weakness and headaches.  Hematological: Does not bruise/bleed easily.  Psychiatric/Behavioral: Negative for dysphoric mood, self-injury, sleep disturbance and suicidal ideas. The patient is not nervous/anxious.        Objective:   Physical Exam  Constitutional: He is oriented to person, place, and time. He appears well-developed and well-nourished. No distress.  HENT:  Head: Normocephalic and atraumatic.  Right Ear: Hearing, tympanic membrane, external ear and ear canal normal. Tympanic membrane is not erythematous and not bulging. No decreased hearing is noted.  Left Ear: Hearing, tympanic membrane, external ear and ear canal normal. Tympanic membrane is not erythematous and not bulging. No decreased hearing is noted.  Nose: Nose normal. Right sinus exhibits no maxillary sinus tenderness and no frontal sinus tenderness. Left sinus exhibits no maxillary sinus tenderness and no frontal sinus tenderness.  Mouth/Throat: Uvula is midline, oropharynx is clear and moist and mucous membranes are normal.  Eyes: Pupils are equal, round, and reactive to light. Conjunctivae are normal.  Neck: Normal range of motion. Neck supple.  Cardiovascular: Normal rate, regular rhythm, normal heart sounds and intact distal pulses.   No murmur heard. PPM palpated on upper L chest wall  Pulmonary/Chest: Effort normal and breath sounds normal. No respiratory distress. He has no wheezes. He has no rales. He exhibits no tenderness.  Genitourinary:  Genitourinary Comments: Vehemently  declined DRE  Musculoskeletal: Normal range of motion. He exhibits tenderness.       Lumbar back: He exhibits tenderness and spasm.  Lymphadenopathy:    He has no cervical adenopathy.  Neurological: He is alert and oriented to person, place, and time. Coordination normal.  Skin: Skin is warm and dry. No rash noted. He is not diaphoretic. No erythema. No pallor.  Psychiatric: He has a normal mood and affect. His behavior is normal. Judgment and thought content normal.  Nursing note and vitals reviewed.         Assessment & Plan:   1. Healthcare maintenance   2. Back pain of lumbar region with sciatica   3. Health care maintenance   4. HTN, goal below 140/90     Back pain of lumbar region with sciatica Daily stretching Heating pad Epson Na++ bath soak Cyclobenzaprine  PRN   Health care maintenance Please continue all medications as directed. Continue excellent water intake and heart healthy diet. We will call when lab results are available. Annual follow-up, sooner if needed.  HTN, goal below 140/90 BP at goal-120/69,  HR 66 Continue Amlodipine  daily. He continues to abstain from tobacco use-GREAT!    FOLLOW-UP:  Return in about 1 year (around 09/02/2018) for CPE.

## 2017-09-02 NOTE — Assessment & Plan Note (Addendum)
BP at goal-120/69, HR 66 Continue Amlodipine  daily. He continues to abstain from tobacco use-GREAT!

## 2017-09-02 NOTE — Assessment & Plan Note (Signed)
Please continue all medications as directed. Continue excellent water intake and heart healthy diet. We will call when lab results are available. Annual follow-up, sooner if needed.

## 2017-09-03 ENCOUNTER — Other Ambulatory Visit: Payer: Self-pay | Admitting: Adult Health

## 2017-09-03 DIAGNOSIS — E559 Vitamin D deficiency, unspecified: Secondary | ICD-10-CM

## 2017-09-03 DIAGNOSIS — E7800 Pure hypercholesterolemia, unspecified: Secondary | ICD-10-CM

## 2017-09-03 LAB — CBC WITH DIFFERENTIAL/PLATELET
BASOS ABS: 0 10*3/uL (ref 0.0–0.2)
Basos: 0 %
EOS (ABSOLUTE): 0.1 10*3/uL (ref 0.0–0.4)
Eos: 2 %
HEMOGLOBIN: 14.3 g/dL (ref 13.0–17.7)
Hematocrit: 41.8 % (ref 37.5–51.0)
IMMATURE GRANS (ABS): 0 10*3/uL (ref 0.0–0.1)
IMMATURE GRANULOCYTES: 0 %
LYMPHS: 48 %
Lymphocytes Absolute: 2.3 10*3/uL (ref 0.7–3.1)
MCH: 30.1 pg (ref 26.6–33.0)
MCHC: 34.2 g/dL (ref 31.5–35.7)
MCV: 88 fL (ref 79–97)
MONOCYTES: 7 %
Monocytes Absolute: 0.3 10*3/uL (ref 0.1–0.9)
NEUTROS ABS: 2 10*3/uL (ref 1.4–7.0)
NEUTROS PCT: 43 %
PLATELETS: 215 10*3/uL (ref 150–379)
RBC: 4.75 x10E6/uL (ref 4.14–5.80)
RDW: 13.8 % (ref 12.3–15.4)
WBC: 4.6 10*3/uL (ref 3.4–10.8)

## 2017-09-03 LAB — COMPREHENSIVE METABOLIC PANEL
ALBUMIN: 4.4 g/dL (ref 3.5–5.5)
ALT: 38 IU/L (ref 0–44)
AST: 29 IU/L (ref 0–40)
Albumin/Globulin Ratio: 1.6 (ref 1.2–2.2)
Alkaline Phosphatase: 118 IU/L — ABNORMAL HIGH (ref 39–117)
BUN / CREAT RATIO: 10 (ref 9–20)
BUN: 9 mg/dL (ref 6–24)
Bilirubin Total: 0.2 mg/dL (ref 0.0–1.2)
CHLORIDE: 98 mmol/L (ref 96–106)
CO2: 26 mmol/L (ref 20–29)
CREATININE: 0.89 mg/dL (ref 0.76–1.27)
Calcium: 9.1 mg/dL (ref 8.7–10.2)
GFR calc non Af Amer: 107 mL/min/{1.73_m2} (ref >59)
GFR, EST AFRICAN AMERICAN: 124 mL/min/{1.73_m2} (ref >59)
GLUCOSE: 105 mg/dL — AB (ref 65–99)
Globulin, Total: 2.8 g/dL (ref 1.5–4.5)
Potassium: 4 mmol/L (ref 3.5–5.2)
Sodium: 137 mmol/L (ref 134–144)
TOTAL PROTEIN: 7.2 g/dL (ref 6.0–8.5)

## 2017-09-03 LAB — HEMOGLOBIN A1C
Est. average glucose Bld gHb Est-mCnc: 114 mg/dL
HEMOGLOBIN A1C: 5.6 % (ref 4.8–5.6)

## 2017-09-03 LAB — VITAMIN D 25 HYDROXY (VIT D DEFICIENCY, FRACTURES): VIT D 25 HYDROXY: 20.9 ng/mL — AB (ref 30.0–100.0)

## 2017-09-03 LAB — LIPID PANEL
CHOLESTEROL TOTAL: 197 mg/dL (ref 100–199)
Chol/HDL Ratio: 7.9 ratio — ABNORMAL HIGH (ref 0.0–5.0)
HDL: 25 mg/dL — AB (ref >39)
LDL Calculated: 139 mg/dL — ABNORMAL HIGH (ref 0–99)
Triglycerides: 164 mg/dL — ABNORMAL HIGH (ref 0–149)
VLDL CHOLESTEROL CAL: 33 mg/dL (ref 5–40)

## 2017-09-03 LAB — TSH: TSH: 0.63 u[IU]/mL (ref 0.450–4.500)

## 2017-09-03 MED ORDER — VITAMIN D (ERGOCALCIFEROL) 1.25 MG (50000 UNIT) PO CAPS: 50000.0000 [IU] | ORAL_CAPSULE | ORAL | 0 refills | Status: DC

## 2017-09-19 ENCOUNTER — Encounter: Payer: PRIVATE HEALTH INSURANCE | Admitting: *Deleted

## 2017-09-19 ENCOUNTER — Telehealth: Payer: Self-pay | Admitting: Cardiology

## 2017-09-19 NOTE — Telephone Encounter (Signed)
Spoke with pt and reminded pt of remote transmission that is due today. Pt verbalized understanding.   

## 2017-09-20 ENCOUNTER — Encounter: Payer: Self-pay | Admitting: Cardiology

## 2017-09-20 NOTE — Progress Notes (Signed)
Letter  

## 2017-09-23 ENCOUNTER — Ambulatory Visit (INDEPENDENT_AMBULATORY_CARE_PROVIDER_SITE_OTHER): Payer: PRIVATE HEALTH INSURANCE | Admitting: *Deleted

## 2017-09-23 DIAGNOSIS — I442 Atrioventricular block, complete: Secondary | ICD-10-CM

## 2017-09-24 NOTE — Progress Notes (Signed)
Remote pacemaker transmission.   

## 2017-09-25 LAB — CUP PACEART REMOTE DEVICE CHECK
Brady Statistic AP VP Percent: 9 %
Brady Statistic AP VS Percent: 0 %
Brady Statistic AS VS Percent: 0 %
Date Time Interrogation Session: 20181015192924
Implantable Lead Implant Date: 20141110
Implantable Lead Model: 5076
Lead Channel Impedance Value: 413 Ohm
Lead Channel Impedance Value: 586 Ohm
Lead Channel Pacing Threshold Amplitude: 0.625 V
Lead Channel Pacing Threshold Pulse Width: 0.4 ms
Lead Channel Setting Sensing Sensitivity: 4 mV
MDC IDC LEAD IMPLANT DT: 20141110
MDC IDC LEAD LOCATION: 753859
MDC IDC LEAD LOCATION: 753860
MDC IDC MSMT BATTERY IMPEDANCE: 184 Ohm
MDC IDC MSMT BATTERY REMAINING LONGEVITY: 116 mo
MDC IDC MSMT BATTERY VOLTAGE: 2.79 V
MDC IDC MSMT LEADCHNL RA PACING THRESHOLD AMPLITUDE: 0.5 V
MDC IDC MSMT LEADCHNL RV PACING THRESHOLD PULSEWIDTH: 0.4 ms
MDC IDC PG IMPLANT DT: 20141110
MDC IDC SET LEADCHNL RA PACING AMPLITUDE: 2 V
MDC IDC SET LEADCHNL RV PACING AMPLITUDE: 2.5 V
MDC IDC SET LEADCHNL RV PACING PULSEWIDTH: 0.4 ms
MDC IDC STAT BRADY AS VP PERCENT: 91 %

## 2017-09-27 ENCOUNTER — Encounter: Payer: Self-pay | Admitting: Cardiology

## 2017-12-23 ENCOUNTER — Encounter: Payer: PRIVATE HEALTH INSURANCE | Admitting: *Deleted

## 2017-12-26 ENCOUNTER — Encounter: Payer: Self-pay | Admitting: Cardiology

## 2018-01-13 ENCOUNTER — Ambulatory Visit (INDEPENDENT_AMBULATORY_CARE_PROVIDER_SITE_OTHER): Payer: PRIVATE HEALTH INSURANCE | Admitting: *Deleted

## 2018-01-13 DIAGNOSIS — I442 Atrioventricular block, complete: Secondary | ICD-10-CM | POA: Diagnosis not present

## 2018-01-13 NOTE — Progress Notes (Signed)
Remote pacemaker transmission.   

## 2018-01-14 ENCOUNTER — Encounter: Payer: Self-pay | Admitting: Cardiology

## 2018-02-02 LAB — CUP PACEART REMOTE DEVICE CHECK
Battery Remaining Longevity: 112 mo
Brady Statistic AS VP Percent: 93 %
Implantable Lead Implant Date: 20141110
Implantable Lead Implant Date: 20141110
Implantable Lead Location: 753860
Implantable Pulse Generator Implant Date: 20141110
Lead Channel Pacing Threshold Amplitude: 0.75 V
Lead Channel Pacing Threshold Pulse Width: 0.4 ms
Lead Channel Setting Pacing Amplitude: 2 V
Lead Channel Setting Sensing Sensitivity: 4 mV
MDC IDC LEAD LOCATION: 753859
MDC IDC MSMT BATTERY IMPEDANCE: 207 Ohm
MDC IDC MSMT BATTERY VOLTAGE: 2.79 V
MDC IDC MSMT LEADCHNL RA IMPEDANCE VALUE: 413 Ohm
MDC IDC MSMT LEADCHNL RA PACING THRESHOLD AMPLITUDE: 0.625 V
MDC IDC MSMT LEADCHNL RA PACING THRESHOLD PULSEWIDTH: 0.4 ms
MDC IDC MSMT LEADCHNL RV IMPEDANCE VALUE: 599 Ohm
MDC IDC SESS DTM: 20190202181755
MDC IDC SET LEADCHNL RV PACING AMPLITUDE: 2.5 V
MDC IDC SET LEADCHNL RV PACING PULSEWIDTH: 0.4 ms
MDC IDC STAT BRADY AP VP PERCENT: 7 %
MDC IDC STAT BRADY AP VS PERCENT: 0 %
MDC IDC STAT BRADY AS VS PERCENT: 0 %

## 2018-02-26 ENCOUNTER — Ambulatory Visit (INDEPENDENT_AMBULATORY_CARE_PROVIDER_SITE_OTHER): Payer: PRIVATE HEALTH INSURANCE | Admitting: Adult Health

## 2018-02-26 ENCOUNTER — Encounter: Payer: Self-pay | Admitting: Adult Health

## 2018-02-26 VITALS — BP 145/105 | HR 87 | Ht 68.5 in | Wt 215.0 lb

## 2018-02-26 DIAGNOSIS — I10 Essential (primary) hypertension: Secondary | ICD-10-CM

## 2018-02-26 DIAGNOSIS — L299 Pruritus, unspecified: Secondary | ICD-10-CM

## 2018-02-26 MED ORDER — CLOBETASOL PROPIONATE 0.05 % EX SHAM: 1.0000 "application " | MEDICATED_SHAMPOO | CUTANEOUS | 2 refills | Status: AC

## 2018-02-26 MED ORDER — AMLODIPINE BESYLATE 5 MG PO TABS: 5.0000 mg | ORAL_TABLET | Freq: Every day | ORAL | 2 refills | Status: DC

## 2018-02-26 NOTE — Assessment & Plan Note (Signed)
He has not been taking his antihypertensive- amlodipine 5mg  QD for months He denies CP/dyspnea/palpitations. Restarted on amlodipine and reinforced the importance of med compliance He verbalized understanding/agreement.

## 2018-02-26 NOTE — Patient Instructions (Addendum)

## 2018-02-26 NOTE — Progress Notes (Signed)
[facility]  Subjective:    Patient ID: Jonathan Logan, male    DOB: 19-Jan-1976, 42 y.o.   MRN: [id number]  HPI:  Jonathan Logan presents with itchy/falky scalp that started 2 months ago immediately after using OTC hair dye. He has been using OTC Head n Shoulders without any sx relief. He has not been taking his antihypertensive- amlodipine 5mg  QD for months He denies CP/dyspnea/palpitations.  Patient Care Team    Relationship Specialty Notifications Start End  Julaine Fusianford, Siana Panameno D, NP PCP - General Family Medicine  07/25/17     Patient Active Problem List   Diagnosis Date Noted  . Itchy scalp 02/26/2018  . HTN, goal below 140/90 07/29/2017  . Health care maintenance 07/29/2017  . Back pain of lumbar region with sciatica 07/29/2017  . Pacemaker 02/19/2014  . Complete heart block (HCC) 10/18/2013  . Syncope 10/18/2013  . Congenital heart block 10/18/2013     Past Medical History:  Diagnosis Date  . Complete heart block (HCC)   . Congenital heart block   . H/O congenital heart block   . Mobitz type 2 second degree AV block   . Syncope      Past Surgical History:  Procedure Laterality Date  . PERMANENT PACEMAKER INSERTION N/A 10/19/2013   Procedure: PERMANENT PACEMAKER INSERTION;  Surgeon: Marinus MawGregg W Taylor, MD;  [facility];  Service: Cardiovascular;  Laterality: N/A;     Family History  Problem Relation Age of Onset  . Diabetes Mother   . Hypertension Mother   . Healthy Sister   . Healthy Brother   . Healthy Brother   . Healthy Brother   . Healthy Daughter   . Healthy Son   . Healthy Daughter   . Premature birth Daughter   . Healthy Son   . Healthy Son   . Healthy Son      Social History   Substance and Sexual Activity  Drug Use No     Social History   Substance and Sexual Activity  Alcohol Use Yes  . Alcohol/week: 0.0 oz   Comment: occassionally     Social History   Tobacco Use  Smoking Status Former Smoker  . Packs/day: 0.05  . Types:  Cigarettes  . Last attempt to quit: 12/10/2013  . Years since quitting: 4.2  Smokeless Tobacco Never Used  Tobacco Comment   VERY LIGHT, OCCASIONAL SMOKER     Outpatient Encounter Medications as of 02/26/2018  Medication Sig  . amLODipine (NORVASC) 5 MG tablet Take 1 tablet (5 mg total) by mouth daily.  . Clobetasol Propionate 0.05 % shampoo Apply 1 application topically 1 day or 1 dose for 1 dose.  . cyclobenzaprine (FLEXERIL) 10 MG tablet Take 1 tablet (10 mg total) by mouth 3 (three) times daily as needed for muscle spasms. (Patient not taking: Reported on 02/26/2018)  . ibuprofen (ADVIL,MOTRIN) 200 MG tablet Take 600 mg by mouth every 6 (six) hours as needed for moderate pain.  . Vitamin D, Ergocalciferol, (DRISDOL) 50000 units CAPS capsule Take 1 capsule (50,000 Units total) by mouth every 7 (seven) days. (Patient not taking: Reported on 02/26/2018)  . [DISCONTINUED] amLODipine (NORVASC) 5 MG tablet Take 1 tablet (5 mg total) by mouth daily. (Patient not taking: Reported on 02/26/2018)   No facility-administered encounter medications on file as of 02/26/2018.     Allergies: Patient has no known allergies.  Body mass index is 32.21 kg/m.  Blood pressure (!) 145/105, pulse 87, height 5' 8.5" (1.74  m), weight 215 lb (97.5 kg), SpO2 98 %.     Review of Systems  Constitutional: Positive for fatigue. Negative for activity change, appetite change, chills, diaphoresis, fever and unexpected weight change.  Eyes: Negative for visual disturbance.  Respiratory: Negative for cough, chest tightness, shortness of breath, wheezing and stridor.   Cardiovascular: Negative for chest pain, palpitations and leg swelling.  Skin: Positive for color change and rash. Negative for pallor and wound.  Neurological: Negative for dizziness and headaches.  Hematological: Does not bruise/bleed easily.       Objective:   Physical Exam  Constitutional: He appears well-developed and well-nourished. No  distress.  HENT:  Head: Normocephalic and atraumatic.  Right Ear: External ear normal.  Left Ear: External ear normal.  Excessively flaky scalp noted, most notably over R frontal/parietal region No open tissue/drainage noted  Skin: Skin is warm and dry. No rash noted. He is not diaphoretic. No erythema. No pallor.  Psychiatric: He has a normal mood and affect. His behavior is normal. Judgment and thought content normal.  Nursing note and vitals reviewed.         Assessment & Plan:   1. Itchy scalp   2. HTN, goal below 140/90     Itchy scalp Use Clopetasol Propionate 0.05% shampoo daily If sx's persist > 79month, f/u in clinic   HTN, goal below 140/90 He has not been taking his antihypertensive- amlodipine 5mg  QD for months He denies CP/dyspnea/palpitations. Restarted on amlodipine and reinforced the importance of med compliance He verbalized understanding/agreement.    FOLLOW-UP:  Return in about 6 months (around 08/29/2018) for CPE, Fasting Labs.

## 2018-02-26 NOTE — Assessment & Plan Note (Signed)
Use Clopetasol Propionate 0.05% shampoo daily If sx's persist > 75month, f/u in clinic

## 2018-04-07 ENCOUNTER — Encounter: Payer: PRIVATE HEALTH INSURANCE | Admitting: Internal Medicine

## 2018-04-08 ENCOUNTER — Encounter: Payer: Self-pay | Admitting: Internal Medicine

## 2018-04-14 ENCOUNTER — Encounter: Payer: PRIVATE HEALTH INSURANCE | Admitting: *Deleted

## 2018-04-15 ENCOUNTER — Telehealth: Payer: Self-pay | Admitting: Cardiology

## 2018-04-15 NOTE — Telephone Encounter (Signed)
Spoke with pt and reminded pt of remote transmission that is due today. Pt verbalized understanding.   

## 2018-04-17 ENCOUNTER — Encounter: Payer: Self-pay | Admitting: Cardiology

## 2018-04-17 NOTE — Progress Notes (Signed)
Letter  

## 2018-04-18 ENCOUNTER — Ambulatory Visit (INDEPENDENT_AMBULATORY_CARE_PROVIDER_SITE_OTHER): Payer: PRIVATE HEALTH INSURANCE | Admitting: *Deleted

## 2018-04-18 DIAGNOSIS — I442 Atrioventricular block, complete: Secondary | ICD-10-CM | POA: Diagnosis not present

## 2018-04-21 NOTE — Progress Notes (Signed)
Remote pacemaker transmission.   

## 2018-05-08 LAB — CUP PACEART REMOTE DEVICE CHECK
Battery Impedance: 208 Ohm
Battery Voltage: 2.79 V
Brady Statistic AP VP Percent: 7 %
Brady Statistic AP VS Percent: 0 %
Brady Statistic AS VS Percent: 0 %
Implantable Lead Implant Date: 20141110
Implantable Lead Model: 5076
Implantable Lead Model: 5076
Lead Channel Impedance Value: 403 Ohm
Lead Channel Impedance Value: 548 Ohm
Lead Channel Pacing Threshold Pulse Width: 0.4 ms
Lead Channel Sensing Intrinsic Amplitude: 2.8 mV
MDC IDC LEAD IMPLANT DT: 20141110
MDC IDC LEAD LOCATION: 753859
MDC IDC LEAD LOCATION: 753860
MDC IDC MSMT BATTERY REMAINING LONGEVITY: 111 mo
MDC IDC MSMT LEADCHNL RA PACING THRESHOLD AMPLITUDE: 0.5 V
MDC IDC MSMT LEADCHNL RV PACING THRESHOLD AMPLITUDE: 0.625 V
MDC IDC MSMT LEADCHNL RV PACING THRESHOLD PULSEWIDTH: 0.4 ms
MDC IDC PG IMPLANT DT: 20141110
MDC IDC SESS DTM: 20190510221404
MDC IDC SET LEADCHNL RA PACING AMPLITUDE: 2 V
MDC IDC SET LEADCHNL RV PACING AMPLITUDE: 2.5 V
MDC IDC SET LEADCHNL RV PACING PULSEWIDTH: 0.4 ms
MDC IDC SET LEADCHNL RV SENSING SENSITIVITY: 4 mV
MDC IDC STAT BRADY AS VP PERCENT: 93 %

## 2018-07-21 ENCOUNTER — Encounter: Payer: PRIVATE HEALTH INSURANCE | Admitting: *Deleted

## 2018-07-21 ENCOUNTER — Telehealth: Payer: Self-pay | Admitting: Cardiology

## 2018-07-21 NOTE — Telephone Encounter (Signed)
Attempted to confirm remote transmission with pt. No answer and was unable to leave a message.   

## 2018-07-23 ENCOUNTER — Encounter: Payer: Self-pay | Admitting: Cardiology

## 2018-07-28 ENCOUNTER — Ambulatory Visit (INDEPENDENT_AMBULATORY_CARE_PROVIDER_SITE_OTHER): Payer: PRIVATE HEALTH INSURANCE | Admitting: *Deleted

## 2018-07-28 DIAGNOSIS — I442 Atrioventricular block, complete: Secondary | ICD-10-CM

## 2018-07-29 NOTE — Progress Notes (Signed)
Remote pacemaker transmission.   

## 2018-09-01 LAB — CUP PACEART REMOTE DEVICE CHECK
Battery Remaining Longevity: 108 mo
Battery Voltage: 2.79 V
Brady Statistic AP VP Percent: 7 %
Brady Statistic AP VS Percent: 0 %
Brady Statistic AS VP Percent: 93 %
Implantable Lead Implant Date: 20141110
Implantable Lead Location: 753860
Implantable Lead Model: 5076
Implantable Lead Model: 5076
Lead Channel Impedance Value: 408 Ohm
Lead Channel Pacing Threshold Amplitude: 0.5 V
Lead Channel Pacing Threshold Pulse Width: 0.4 ms
Lead Channel Pacing Threshold Pulse Width: 0.4 ms
Lead Channel Setting Pacing Amplitude: 2 V
Lead Channel Setting Pacing Pulse Width: 0.4 ms
MDC IDC LEAD IMPLANT DT: 20141110
MDC IDC LEAD LOCATION: 753859
MDC IDC MSMT BATTERY IMPEDANCE: 232 Ohm
MDC IDC MSMT LEADCHNL RV IMPEDANCE VALUE: 582 Ohm
MDC IDC MSMT LEADCHNL RV PACING THRESHOLD AMPLITUDE: 0.625 V
MDC IDC PG IMPLANT DT: 20141110
MDC IDC SESS DTM: 20190817000911
MDC IDC SET LEADCHNL RV PACING AMPLITUDE: 2.5 V
MDC IDC SET LEADCHNL RV SENSING SENSITIVITY: 4 mV
MDC IDC STAT BRADY AS VS PERCENT: 0 %

## 2018-09-01 NOTE — Progress Notes (Deleted)
   Subjective:    Patient ID: Jonathan Logan, male    DOB: May 21, 1976, 42 y.o.   MRN: [id number]  HPI:  Jonathan Logan is here for CPE  Healthcare Maintenance: Immunizations-  Patient Care Team    Relationship Specialty Notifications Start End  Julaine Fusianford, Makale Pindell D, NP PCP - General Family Medicine  07/25/17     Patient Active Problem List   Diagnosis Date Noted  . Itchy scalp 02/26/2018  . HTN, goal below 140/90 07/29/2017  . Health care maintenance 07/29/2017  . Back pain of lumbar region with sciatica 07/29/2017  . Pacemaker 02/19/2014  . Complete heart block (HCC) 10/18/2013  . Syncope 10/18/2013  . Congenital heart block 10/18/2013     Past Medical History:  Diagnosis Date  . Complete heart block (HCC)   . Congenital heart block   . H/O congenital heart block   . Mobitz type 2 second degree AV block   . Syncope      Past Surgical History:  Procedure Laterality Date  . PERMANENT PACEMAKER INSERTION N/A 10/19/2013   Procedure: PERMANENT PACEMAKER INSERTION;  Surgeon: Marinus MawGregg W Taylor, MD;  [facility];  Service: Cardiovascular;  Laterality: N/A;     Family History  Problem Relation Age of Onset  . Diabetes Mother   . Hypertension Mother   . Healthy Sister   . Healthy Brother   . Healthy Brother   . Healthy Brother   . Healthy Daughter   . Healthy Son   . Healthy Daughter   . Premature birth Daughter   . Healthy Son   . Healthy Son   . Healthy Son      Social History   Substance and Sexual Activity  Drug Use No     Social History   Substance and Sexual Activity  Alcohol Use Yes  . Alcohol/week: 0.0 standard drinks   Comment: occassionally     Social History   Tobacco Use  Smoking Status Former Smoker  . Packs/day: 0.05  . Types: Cigarettes  . Last attempt to quit: 12/10/2013  . Years since quitting: 4.7  Smokeless Tobacco Never Used  Tobacco Comment   VERY LIGHT, OCCASIONAL SMOKER     Outpatient Encounter Medications as of  09/03/2018  Medication Sig  . amLODipine (NORVASC) 5 MG tablet Take 1 tablet (5 mg total) by mouth daily.  . cyclobenzaprine (FLEXERIL) 10 MG tablet Take 1 tablet (10 mg total) by mouth 3 (three) times daily as needed for muscle spasms. (Patient not taking: Reported on 02/26/2018)  . ibuprofen (ADVIL,MOTRIN) 200 MG tablet Take 600 mg by mouth every 6 (six) hours as needed for moderate pain.  . Vitamin D, Ergocalciferol, (DRISDOL) 50000 units CAPS capsule Take 1 capsule (50,000 Units total) by mouth every 7 (seven) days. (Patient not taking: Reported on 02/26/2018)   No facility-administered encounter medications on file as of 09/03/2018.     Allergies: Patient has no known allergies.  There is no height or weight on file to calculate BMI.  There were no vitals taken for this visit.     Review of Systems     Objective:   Physical Exam        Assessment & Plan:  No diagnosis found.  No problem-specific Assessment & Plan notes found for this encounter.    FOLLOW-UP:  No follow-ups on file.

## 2018-09-03 ENCOUNTER — Encounter: Payer: PRIVATE HEALTH INSURANCE | Admitting: Adult Health

## 2018-10-27 ENCOUNTER — Telehealth: Payer: Self-pay

## 2018-10-27 NOTE — Telephone Encounter (Signed)
Spoke with pt and reminded pt of remote transmission that is due today. Pt verbalized understanding.   

## 2018-10-30 ENCOUNTER — Encounter: Payer: Self-pay | Admitting: Cardiology

## 2018-11-03 ENCOUNTER — Ambulatory Visit (INDEPENDENT_AMBULATORY_CARE_PROVIDER_SITE_OTHER): Payer: PRIVATE HEALTH INSURANCE

## 2018-11-03 DIAGNOSIS — I442 Atrioventricular block, complete: Secondary | ICD-10-CM

## 2018-11-03 NOTE — Progress Notes (Signed)
Remote pacemaker transmission.   

## 2018-12-26 LAB — CUP PACEART REMOTE DEVICE CHECK
Brady Statistic AS VS Percent: 0 %
Date Time Interrogation Session: 20191123225449
Implantable Lead Implant Date: 20141110
Implantable Pulse Generator Implant Date: 20141110
Lead Channel Impedance Value: 580 Ohm
Lead Channel Pacing Threshold Amplitude: 0.625 V
Lead Channel Pacing Threshold Pulse Width: 0.4 ms
Lead Channel Setting Pacing Amplitude: 2 V
Lead Channel Setting Sensing Sensitivity: 4 mV
MDC IDC LEAD IMPLANT DT: 20141110
MDC IDC LEAD LOCATION: 753859
MDC IDC LEAD LOCATION: 753860
MDC IDC MSMT BATTERY IMPEDANCE: 280 Ohm
MDC IDC MSMT BATTERY REMAINING LONGEVITY: 102 mo
MDC IDC MSMT BATTERY VOLTAGE: 2.79 V
MDC IDC MSMT LEADCHNL RA IMPEDANCE VALUE: 408 Ohm
MDC IDC MSMT LEADCHNL RA PACING THRESHOLD AMPLITUDE: 0.5 V
MDC IDC MSMT LEADCHNL RV PACING THRESHOLD PULSEWIDTH: 0.4 ms
MDC IDC SET LEADCHNL RV PACING AMPLITUDE: 2.5 V
MDC IDC SET LEADCHNL RV PACING PULSEWIDTH: 0.4 ms
MDC IDC STAT BRADY AP VP PERCENT: 8 %
MDC IDC STAT BRADY AP VS PERCENT: 0 %
MDC IDC STAT BRADY AS VP PERCENT: 92 %

## 2019-02-02 ENCOUNTER — Encounter: Payer: PRIVATE HEALTH INSURANCE | Admitting: *Deleted

## 2019-02-04 ENCOUNTER — Telehealth: Payer: Self-pay

## 2019-02-04 NOTE — Telephone Encounter (Signed)
Unable to leave a message for patient to remind of missed remote transmission.  

## 2019-02-10 ENCOUNTER — Encounter: Payer: Self-pay | Admitting: Cardiology

## 2019-02-10 NOTE — Progress Notes (Signed)
Letter  

## 2019-03-16 ENCOUNTER — Ambulatory Visit (INDEPENDENT_AMBULATORY_CARE_PROVIDER_SITE_OTHER): Payer: PRIVATE HEALTH INSURANCE | Admitting: *Deleted

## 2019-03-16 ENCOUNTER — Other Ambulatory Visit: Payer: Self-pay

## 2019-03-16 DIAGNOSIS — I442 Atrioventricular block, complete: Secondary | ICD-10-CM | POA: Diagnosis not present

## 2019-03-16 LAB — CUP PACEART REMOTE DEVICE CHECK
Battery Impedance: 305 Ohm
Battery Remaining Longevity: 99 mo
Battery Voltage: 2.79 V
Brady Statistic AP VP Percent: 8 %
Brady Statistic AP VS Percent: 0 %
Brady Statistic AS VP Percent: 92 %
Brady Statistic AS VS Percent: 0 %
Date Time Interrogation Session: 20200404195112
Implantable Lead Implant Date: 20141110
Implantable Lead Implant Date: 20141110
Implantable Lead Location: 753859
Implantable Lead Location: 753860
Implantable Lead Model: 5076
Implantable Lead Model: 5076
Implantable Pulse Generator Implant Date: 20141110
Lead Channel Impedance Value: 397 Ohm
Lead Channel Impedance Value: 567 Ohm
Lead Channel Pacing Threshold Amplitude: 0.5 V
Lead Channel Pacing Threshold Amplitude: 0.75 V
Lead Channel Pacing Threshold Pulse Width: 0.4 ms
Lead Channel Pacing Threshold Pulse Width: 0.4 ms
Lead Channel Setting Pacing Amplitude: 2 V
Lead Channel Setting Pacing Amplitude: 2.5 V
Lead Channel Setting Pacing Pulse Width: 0.4 ms
Lead Channel Setting Sensing Sensitivity: 4 mV

## 2019-03-25 NOTE — Progress Notes (Signed)
Remote pacemaker transmission.   

## 2019-06-15 ENCOUNTER — Encounter: Payer: PRIVATE HEALTH INSURANCE | Admitting: *Deleted

## 2019-06-16 ENCOUNTER — Telehealth: Payer: Self-pay

## 2019-06-16 NOTE — Telephone Encounter (Signed)
Left message for patient to remind of missed remote transmission.  

## 2019-06-25 ENCOUNTER — Encounter: Payer: Self-pay | Admitting: Cardiology

## 2019-07-06 ENCOUNTER — Ambulatory Visit (INDEPENDENT_AMBULATORY_CARE_PROVIDER_SITE_OTHER): Payer: PRIVATE HEALTH INSURANCE | Admitting: *Deleted

## 2019-07-06 DIAGNOSIS — I442 Atrioventricular block, complete: Secondary | ICD-10-CM | POA: Diagnosis not present

## 2019-07-06 DIAGNOSIS — I459 Conduction disorder, unspecified: Secondary | ICD-10-CM

## 2019-07-06 LAB — CUP PACEART REMOTE DEVICE CHECK
Battery Impedance: 354 Ohm
Battery Remaining Longevity: 93 mo
Battery Voltage: 2.79 V
Brady Statistic AP VP Percent: 8 %
Brady Statistic AP VS Percent: 0 %
Brady Statistic AS VP Percent: 92 %
Brady Statistic AS VS Percent: 0 %
Date Time Interrogation Session: 20200727064140
Implantable Lead Implant Date: 20141110
Implantable Lead Implant Date: 20141110
Implantable Lead Location: 753859
Implantable Lead Location: 753860
Implantable Lead Model: 5076
Implantable Lead Model: 5076
Implantable Pulse Generator Implant Date: 20141110
Lead Channel Impedance Value: 387 Ohm
Lead Channel Impedance Value: 553 Ohm
Lead Channel Pacing Threshold Amplitude: 0.5 V
Lead Channel Pacing Threshold Amplitude: 0.625 V
Lead Channel Pacing Threshold Pulse Width: 0.4 ms
Lead Channel Pacing Threshold Pulse Width: 0.4 ms
Lead Channel Setting Pacing Amplitude: 2 V
Lead Channel Setting Pacing Amplitude: 2.5 V
Lead Channel Setting Pacing Pulse Width: 0.4 ms
Lead Channel Setting Sensing Sensitivity: 4 mV

## 2019-07-23 NOTE — Progress Notes (Signed)
Remote pacemaker transmission.   

## 2019-09-22 NOTE — Progress Notes (Signed)
Electrophysiology Office Note Date: 09/23/2019  ID:  Jonathan Logan, DOB September 02, 1976, MRN [id number]  PCP: Julaine Fusi, NP Primary Cardiologist: No primary care provider on file. Electrophysiologist: Dr. Ladona Ridgel  CC: Pacemaker follow-up  Jonathan Logan is a 43 y.o. male with h/o congenital heart blockseen today for Dr. Ladona Ridgel. He presents today for routine electrophysiology followup.  Since last being seen in our clinic, the patient reports doing very well. He continues to have issues with ED, and has run out of sildenafil. he denies chest pain, palpitations, dyspnea, PND, orthopnea, nausea, vomiting, dizziness, syncope, edema, weight gain, or early satiety.  Device History: Medtronic Dual Chamber PPM implanted 10/19/2013 for congenital heart block  Past Medical History:  Diagnosis Date  . Complete heart block (HCC)   . Congenital heart block   . H/O congenital heart block   . Mobitz type 2 second degree AV block   . Syncope    Past Surgical History:  Procedure Laterality Date  . PERMANENT PACEMAKER INSERTION N/A 10/19/2013   Procedure: PERMANENT PACEMAKER INSERTION;  Surgeon: Marinus Maw, MD;  Location: [facility] CATH LAB;  Service: Cardiovascular;  Laterality: N/A;    Current Outpatient Medications  Medication Sig Dispense Refill  . amLODipine (NORVASC) 5 MG tablet Take 1 tablet (5 mg total) by mouth daily. 90 tablet 2  . ibuprofen (ADVIL,MOTRIN) 200 MG tablet Take 600 mg by mouth every 6 (six) hours as needed for moderate pain.    . Vitamin D, Ergocalciferol, (DRISDOL) 50000 units CAPS capsule Take 1 capsule (50,000 Units total) by mouth every 7 (seven) days. 16 capsule 0   No current facility-administered medications for this visit.     Allergies:   Patient has no known allergies.   Social History: Social History   Socioeconomic History  . Marital status: Married    Spouse name: Not on file  . Number of children: Not on file  . Years of education: Not on  file  . Highest education level: Not on file  Occupational History  . Not on file  Social Needs  . Financial resource strain: Not on file  . Food insecurity    Worry: Not on file    Inability: Not on file  . Transportation needs    Medical: Not on file    Non-medical: Not on file  Tobacco Use  . Smoking status: Former Smoker    Packs/day: 0.05    Types: Cigarettes    Quit date: 12/10/2013    Years since quitting: 5.7  . Smokeless tobacco: Never Used  . Tobacco comment: VERY LIGHT, OCCASIONAL SMOKER  Substance and Sexual Activity  . Alcohol use: Yes    Alcohol/week: 0.0 standard drinks    Comment: occassionally  . Drug use: No  . Sexual activity: Yes    Birth control/protection: None  Lifestyle  . Physical activity    Days per week: Not on file    Minutes per session: Not on file  . Stress: Not on file  Relationships  . Social Musician on phone: Not on file    Gets together: Not on file    Attends religious service: Not on file    Active member of club or organization: Not on file    Attends meetings of clubs or organizations: Not on file    Relationship status: Not on file  . Intimate partner violence    Fear of current or ex partner: Not on file  Emotionally abused: Not on file    Physically abused: Not on file    Forced sexual activity: Not on file  Other Topics Concern  . Not on file  Social History Narrative  . Not on file    Family History: Family History  Problem Relation Age of Onset  . Diabetes Mother   . Hypertension Mother   . Healthy Sister   . Healthy Brother   . Healthy Brother   . Healthy Brother   . Healthy Daughter   . Healthy Son   . Healthy Daughter   . Premature birth Daughter   . Healthy Son   . Healthy Son   . Healthy Son      Review of Systems: All other systems reviewed and are otherwise negative except as noted above.  Physical Exam: Vitals:   09/23/19 1045  BP: 124/70  Logan: 71  Weight: 210 lb (95.3  kg)  Height: 5' 8.5" (1.74 m)     GEN- The patient is well appearing, alert and oriented x 3 today.   HEENT: normocephalic, atraumatic; sclera clear, conjunctiva pink; hearing intact; oropharynx clear; neck supple  Lungs- Clear to ausculation bilaterally, normal work of breathing.  No wheezes, rales, rhonchi Heart- Regular rate and rhythm, no murmurs, rubs or gallops  GI- soft, non-tender, non-distended, bowel sounds present  Extremities- no clubbing, cyanosis, or edema  MS- no significant deformity or atrophy Skin- warm and dry, no rash or lesion; PPM pocket well healed Psych- euthymic mood, full affect Neuro- strength and sensation are intact  PPM Interrogation- reviewed in detail today,  See PACEART report  EKG:  EKG is ordered today. The ekg ordered today shows AS-VP 71 bpm, personally reviewed  Recent Labs: No results found for requested labs within last 8760 hours.   Wt Readings from Last 3 Encounters:  09/23/19 210 lb (95.3 kg)  02/26/18 215 lb (97.5 kg)  09/02/17 207 lb 1.6 oz (93.9 kg)     Other studies Reviewed: Additional studies/ records that were reviewed today include: Echo 10/2013 shows LVEF 60-65%, Previous EP office notes, Previous remote checks, Most recent labwork.   Assessment and Plan:  1.  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 HTN Stable off meds.   3. ED Refill sildenafil  Current medicines are reviewed at length with the patient today.   The patient does not have concerns regarding his medicines.  The following changes were made today:  Refill as above.   Labs/ tests ordered today include:  Orders Placed This Encounter  Procedures  . EKG 12-Lead   Disposition:   Follow up with Dr. Lovena Le in 12 months.   Jacalyn Lefevre, PA-C  09/23/2019 10:56 AM  [facility] [address] [place] [address]  [phone] (office) [phone] (fax)

## 2019-09-23 ENCOUNTER — Ambulatory Visit (INDEPENDENT_AMBULATORY_CARE_PROVIDER_SITE_OTHER): Payer: PRIVATE HEALTH INSURANCE | Admitting: Student

## 2019-09-23 ENCOUNTER — Other Ambulatory Visit: Payer: Self-pay

## 2019-09-23 VITALS — BP 124/70 | HR 71 | Ht 68.5 in | Wt 210.0 lb

## 2019-09-23 DIAGNOSIS — N529 Male erectile dysfunction, unspecified: Secondary | ICD-10-CM | POA: Insufficient documentation

## 2019-09-23 DIAGNOSIS — I442 Atrioventricular block, complete: Secondary | ICD-10-CM

## 2019-09-23 LAB — CUP PACEART INCLINIC DEVICE CHECK
Battery Impedance: 378 Ohm
Battery Remaining Longevity: 91 mo
Battery Voltage: 2.79 V
Brady Statistic AP VP Percent: 8 %
Brady Statistic AP VS Percent: 0 %
Brady Statistic AS VP Percent: 92 %
Brady Statistic AS VS Percent: 0 %
Date Time Interrogation Session: 20201014133204
Implantable Lead Implant Date: 20141110
Implantable Lead Implant Date: 20141110
Implantable Lead Location: 753859
Implantable Lead Location: 753860
Implantable Lead Model: 5076
Implantable Lead Model: 5076
Implantable Pulse Generator Implant Date: 20141110
Lead Channel Impedance Value: 397 Ohm
Lead Channel Impedance Value: 541 Ohm
Lead Channel Pacing Threshold Amplitude: 0.5 V
Lead Channel Pacing Threshold Amplitude: 0.5 V
Lead Channel Pacing Threshold Amplitude: 0.75 V
Lead Channel Pacing Threshold Amplitude: 0.75 V
Lead Channel Pacing Threshold Pulse Width: 0.4 ms
Lead Channel Pacing Threshold Pulse Width: 0.4 ms
Lead Channel Pacing Threshold Pulse Width: 0.4 ms
Lead Channel Pacing Threshold Pulse Width: 0.4 ms
Lead Channel Setting Pacing Amplitude: 2 V
Lead Channel Setting Pacing Amplitude: 2.5 V
Lead Channel Setting Pacing Pulse Width: 0.4 ms
Lead Channel Setting Sensing Sensitivity: 4 mV

## 2019-09-23 MED ORDER — SILDENAFIL CITRATE 20 MG PO TABS: 20.0000 mg | ORAL_TABLET | Freq: Three times a day (TID) | ORAL | 3 refills | Status: DC | PRN

## 2019-09-23 NOTE — Patient Instructions (Signed)
Medication Instructions:  Your physician recommends that you continue on your current medications as directed. Please refer to the Current Medication list given to you today.  If you need a refill on your cardiac medications before your next appointment, please call your pharmacy.   Lab work: NONE ORDERED  TODAY   If you have labs (blood work) drawn today and your tests are completely normal, you will receive your results only by: . MyChart Message (if you have MyChart) OR . A paper copy in the mail If you have any lab test that is abnormal or we need to change your treatment, we will call you to review the results.  Testing/Procedures: NONE ORDERED  TODAY  Follow-Up: At [facility], you and your health needs are our priority.  As part of our continuing mission to provide you with exceptional heart care, we have created designated Provider Care Teams.  These Care Teams include your primary Cardiologist (physician) and Advanced Practice Providers (APPs -  Physician Assistants and Nurse Practitioners) who all work together to provide you with the care you need, when you need it. You will need a follow up appointment in 1 years.  Please call our office 2 months in advance to schedule this appointment.  You may see Dr. Taylor  or one of the following Advanced Practice Providers on your designated Care Team:   Amber Seiler, NP . Renee Ursuy, PA-C . Andrew Tillery PA-C   Any Other Special Instructions Will Be Listed Below (If Applicable).    

## 2019-10-02 ENCOUNTER — Ambulatory Visit: Payer: PRIVATE HEALTH INSURANCE | Admitting: Student

## 2019-10-06 ENCOUNTER — Encounter: Payer: PRIVATE HEALTH INSURANCE | Admitting: *Deleted

## 2019-11-19 ENCOUNTER — Ambulatory Visit (INDEPENDENT_AMBULATORY_CARE_PROVIDER_SITE_OTHER): Payer: PRIVATE HEALTH INSURANCE | Admitting: *Deleted

## 2019-11-19 DIAGNOSIS — I442 Atrioventricular block, complete: Secondary | ICD-10-CM

## 2019-11-19 LAB — CUP PACEART REMOTE DEVICE CHECK
Battery Impedance: 427 Ohm
Battery Remaining Longevity: 88 mo
Battery Voltage: 2.78 V
Brady Statistic AP VP Percent: 10 %
Brady Statistic AP VS Percent: 0 %
Brady Statistic AS VP Percent: 90 %
Brady Statistic AS VS Percent: 0 %
Date Time Interrogation Session: 20201210161439
Implantable Lead Implant Date: 20141110
Implantable Lead Implant Date: 20141110
Implantable Lead Location: 753859
Implantable Lead Location: 753860
Implantable Lead Model: 5076
Implantable Lead Model: 5076
Implantable Pulse Generator Implant Date: 20141110
Lead Channel Impedance Value: 408 Ohm
Lead Channel Impedance Value: 590 Ohm
Lead Channel Pacing Threshold Amplitude: 0.5 V
Lead Channel Pacing Threshold Amplitude: 0.625 V
Lead Channel Pacing Threshold Pulse Width: 0.4 ms
Lead Channel Pacing Threshold Pulse Width: 0.4 ms
Lead Channel Setting Pacing Amplitude: 2 V
Lead Channel Setting Pacing Amplitude: 2.5 V
Lead Channel Setting Pacing Pulse Width: 0.4 ms
Lead Channel Setting Sensing Sensitivity: 4 mV

## 2019-12-26 NOTE — Progress Notes (Signed)
PPM remote 

## 2020-02-18 ENCOUNTER — Ambulatory Visit (INDEPENDENT_AMBULATORY_CARE_PROVIDER_SITE_OTHER): Payer: PRIVATE HEALTH INSURANCE | Admitting: *Deleted

## 2020-02-18 DIAGNOSIS — I442 Atrioventricular block, complete: Secondary | ICD-10-CM | POA: Diagnosis not present

## 2020-02-21 LAB — CUP PACEART REMOTE DEVICE CHECK
Battery Impedance: 503 Ohm
Battery Remaining Longevity: 81 mo
Battery Voltage: 2.78 V
Brady Statistic AP VP Percent: 9 %
Brady Statistic AP VS Percent: 0 %
Brady Statistic AS VP Percent: 91 %
Brady Statistic AS VS Percent: 0 %
Date Time Interrogation Session: 20210313104431
Implantable Lead Implant Date: 20141110
Implantable Lead Implant Date: 20141110
Implantable Lead Location: 753859
Implantable Lead Location: 753860
Implantable Lead Model: 5076
Implantable Lead Model: 5076
Implantable Pulse Generator Implant Date: 20141110
Lead Channel Impedance Value: 392 Ohm
Lead Channel Impedance Value: 548 Ohm
Lead Channel Pacing Threshold Amplitude: 0.625 V
Lead Channel Pacing Threshold Amplitude: 0.625 V
Lead Channel Pacing Threshold Pulse Width: 0.4 ms
Lead Channel Pacing Threshold Pulse Width: 0.4 ms
Lead Channel Setting Pacing Amplitude: 2 V
Lead Channel Setting Pacing Amplitude: 2.5 V
Lead Channel Setting Pacing Pulse Width: 0.4 ms
Lead Channel Setting Sensing Sensitivity: 4 mV

## 2020-02-22 NOTE — Progress Notes (Signed)
PPM Remote  

## 2020-03-26 ENCOUNTER — Emergency Department (HOSPITAL_COMMUNITY)
Admission: EM | Admit: 2020-03-26 | Discharge: 2020-03-26 | Disposition: A | Discharge status: Home / Self Care | Payer: BC Managed Care – PPO | Attending: Emergency Medicine | Admitting: Emergency Medicine

## 2020-03-26 ENCOUNTER — Other Ambulatory Visit: Payer: Self-pay

## 2020-03-26 ENCOUNTER — Encounter (HOSPITAL_COMMUNITY): Payer: Self-pay

## 2020-03-26 DIAGNOSIS — M545 Low back pain: Secondary | ICD-10-CM | POA: Diagnosis not present

## 2020-03-26 DIAGNOSIS — M5442 Lumbago with sciatica, left side: Secondary | ICD-10-CM | POA: Insufficient documentation

## 2020-03-26 DIAGNOSIS — Z87891 Personal history of nicotine dependence: Secondary | ICD-10-CM | POA: Diagnosis not present

## 2020-03-26 DIAGNOSIS — Z950 Presence of cardiac pacemaker: Secondary | ICD-10-CM | POA: Insufficient documentation

## 2020-03-26 MED ORDER — LIDOCAINE 5 % EX PTCH
1.0000 | MEDICATED_PATCH | Freq: Once | CUTANEOUS | Status: DC
  Administered 2020-03-26: 1 via TRANSDERMAL
  Filled 2020-03-26: qty 1

## 2020-03-26 MED ORDER — ONDANSETRON HCL 4 MG PO TABS
4.0000 mg | ORAL_TABLET | Freq: Once | ORAL | Status: AC
  Administered 2020-03-26: 4 mg via ORAL
  Filled 2020-03-26: qty 1

## 2020-03-26 MED ORDER — PREDNISONE 20 MG PO TABS
60.0000 mg | ORAL_TABLET | Freq: Once | ORAL | Status: AC
  Administered 2020-03-26: 16:00:00 60 mg via ORAL
  Filled 2020-03-26: qty 3

## 2020-03-26 MED ORDER — KETOROLAC TROMETHAMINE 60 MG/2ML IM SOLN
15.0000 mg | Freq: Once | INTRAMUSCULAR | Status: AC
  Administered 2020-03-26: 16:00:00 15 mg via INTRAMUSCULAR
  Filled 2020-03-26: qty 2

## 2020-03-26 MED ORDER — HYDROCODONE-ACETAMINOPHEN 5-325 MG PO TABS
1.0000 | ORAL_TABLET | Freq: Once | ORAL | Status: AC
  Administered 2020-03-26: 1 via ORAL
  Filled 2020-03-26: qty 1

## 2020-03-26 MED ORDER — PREDNISONE 20 MG PO TABS: 20.0000 mg | ORAL_TABLET | Freq: Every day | ORAL | 0 refills | Status: AC

## 2020-03-26 NOTE — Discharge Instructions (Addendum)
You have been seen today for back pain. Please read and follow all provided instructions. Return to the emergency room for worsening condition or new concerning symptoms.    1. Medications:  Prescription sent to your pharmacy for prednisone  Continue usual home medications Take medications as prescribed. Please review all of the medicines and only take them if you do not have an allergy to them.   2. Treatment: rest, drink plenty of fluids  3. Follow Up:  Please follow up with primary care provider by scheduling an appointment in 2-5 days for symptom recheck.    It is also a possibility that you have an allergic reaction to any of the medicines that you have been prescribed - Everybody reacts differently to medications and while MOST people have no trouble with most medicines, you may have a reaction such as nausea, vomiting, rash, swelling, shortness of breath. If this is the case, please stop taking the medicine immediately and contact your physician.  ?

## 2020-03-26 NOTE — ED Triage Notes (Signed)
Patient c/o left lower back pain that radiates down the left leg x 1 month, but worsening today. Patieant denies any injury.

## 2020-03-26 NOTE — ED Provider Notes (Signed)
Day DEPT Provider Note   CSN: [id number] Arrival date & time: 03/26/20  1351     History Chief Complaint  Patient presents with  . Back Pain    Jonathan Logan is a 44 y.o. male with past medical history significant for congential heart block with pacemaker presents to emergency department today with chief complaint of progressively worsening intermittent back pain x 1 month.  Pain has worsened over the last x 1 week.  Pain is located in his left lower back.  He states it radiates down his left leg.  Pain is worse with movement.  He denies any known injury or history of similar pain.  He has tried taking ibuprofen with minimal symptom relief.  Last dose was yesterday.  Denies any urinary symptoms or testicle pain. Denies fevers, weight loss, numbness/weakness of upper and lower extremities, bowel/bladder incontinence, urinary retention, history of cancer, saddle anesthesia, history of back surgery, history of IVDA.  History provided by patient with additional history obtained from chart review.      Past Medical History:  Diagnosis Date  . Complete heart block ([place])   . Congenital heart block   . H/O congenital heart block   . Mobitz type 2 second degree AV block   . Syncope     Patient Active Problem List   Diagnosis Date Noted  . Erectile dysfunction 09/23/2019  . Itchy scalp 02/26/2018  . HTN, goal below 140/90 07/29/2017  . Health care maintenance 07/29/2017  . Back pain of lumbar region with sciatica 07/29/2017  . Pacemaker 02/19/2014  . Complete heart block ([place]) 10/18/2013  . Syncope 10/18/2013  . Congenital heart block 10/18/2013    Past Surgical History:  Procedure Laterality Date  . PERMANENT PACEMAKER INSERTION N/A 10/19/2013   Procedure: PERMANENT PACEMAKER INSERTION;  Surgeon: Evans Lance, MD;  Location: [facility] CATH LAB;  Service: Cardiovascular;  Laterality: N/A;       Family History  Problem Relation Age of  Onset  . Diabetes Mother   . Hypertension Mother   . Healthy Sister   . Healthy Brother   . Healthy Brother   . Healthy Brother   . Healthy Daughter   . Healthy Son   . Healthy Daughter   . Premature birth Daughter   . Healthy Son   . Healthy Son   . Healthy Son     Social History   Tobacco Use  . Smoking status: Former Smoker    Packs/day: 0.05    Types: Cigarettes    Quit date: 12/10/2013    Years since quitting: 6.2  . Smokeless tobacco: Never Used  . Tobacco comment: VERY LIGHT, OCCASIONAL SMOKER  Substance Use Topics  . Alcohol use: Yes    Alcohol/week: 0.0 standard drinks    Comment: occassionally  . Drug use: No    Home Medications Prior to Admission medications   Medication Sig Start Date End Date Taking? Authorizing Provider  amLODipine (NORVASC) 5 MG tablet Take 1 tablet (5 mg total) by mouth daily. 02/26/18   Danford, Valetta Fuller D, NP  ibuprofen (ADVIL,MOTRIN) 200 MG tablet Take 600 mg by mouth every 6 (six) hours as needed for moderate pain.    [provider]  predniSONE (DELTASONE) 20 MG tablet Take 1 tablet (20 mg total) by mouth daily for 4 days. 03/27/20 03/31/20  Tanika Bracco E, PA-C  sildenafil (REVATIO) 20 MG tablet Take 1 tablet (20 mg total) by mouth 3 (three) times daily as  needed. 09/23/19   Graciella Freer, PA-C  Vitamin D, Ergocalciferol, (DRISDOL) 50000 units CAPS capsule Take 1 capsule (50,000 Units total) by mouth every 7 (seven) days. 09/03/17   Julaine Fusi, NP    Allergies    Patient has no known allergies.  Review of Systems   Review of Systems All other systems are reviewed and are negative for acute change except as noted in the HPI.  Physical Exam Updated Vital Signs BP 139/79 (BP Location: Left Arm)   Pulse 75   Temp 98.4 F (36.9 C) (Oral)   Resp 18   Ht 5' 8.5" (1.74 m)   Wt 86.2 kg   SpO2 97%   BMI 28.47 kg/m   Physical Exam Vitals and nursing note reviewed.  Constitutional:      General: He is not  in acute distress.    Appearance: He is not ill-appearing.  HENT:     Head: Normocephalic and atraumatic.     Right Ear: Tympanic membrane and external ear normal.     Left Ear: Tympanic membrane and external ear normal.     Nose: Nose normal.     Mouth/Throat:     Mouth: Mucous membranes are moist.     Pharynx: Oropharynx is clear.  Eyes:     General: No scleral icterus.       Right eye: No discharge.        Left eye: No discharge.     Extraocular Movements: Extraocular movements intact.     Conjunctiva/sclera: Conjunctivae normal.     Pupils: Pupils are equal, round, and reactive to light.  Neck:     Vascular: No JVD.  Cardiovascular:     Rate and Rhythm: Normal rate and regular rhythm.     Pulses: Normal pulses.          Radial pulses are 2+ on the right side and 2+ on the left side.       Dorsalis pedis pulses are 2+ on the right side and 2+ on the left side.     Heart sounds: Normal heart sounds.  Pulmonary:     Comments: Lungs clear to auscultation in all fields. Symmetric chest rise. No wheezing, rales, or rhonchi. Abdominal:     Palpations: There is no mass.     Hernia: No hernia is present.     Comments: Abdomen is soft, non-distended, and non-tender in all quadrants. No rigidity, no guarding. No peritoneal signs.  Musculoskeletal:        General: Normal range of motion.     Cervical back: Normal range of motion.       Back:     Right lower leg: No edema.     Left lower leg: No edema.     Comments: Tender to palpation as depicted in image above.  Pain is reproducible on exam.  No overlying skin changes.  Negative straight leg raise test bilaterally.  Skin:    General: Skin is warm and dry.     Capillary Refill: Capillary refill takes less than 2 seconds.  Neurological:     Mental Status: He is oriented to person, place, and time.     GCS: GCS eye subscore is 4. GCS verbal subscore is 5. GCS motor subscore is 6.     Comments: Fluent speech, no facial droop.   Sensation grossly intact to light touch in the lower extremities bilaterally. No saddle anesthesias. Strength 5/5 with flexion and extension at the bilateral hips, knees,  and ankles. No noted gait deficit. Coordination intact with heel to shin testing.   Psychiatric:        Behavior: Behavior normal.     ED Results / Procedures / Treatments   Labs (all labs ordered are listed, but only abnormal results are displayed) Labs Reviewed - No data to display  EKG None  Radiology No results found.  Procedures Procedures (including critical care time)  Medications Ordered in ED Medications  lidocaine (LIDODERM) 5 % 1 patch (1 patch Transdermal Patch Applied 03/26/20 1629)  ketorolac (TORADOL) injection 15 mg (15 mg Intramuscular Given 03/26/20 1626)  predniSONE (DELTASONE) tablet 60 mg (60 mg Oral Given 03/26/20 1626)  HYDROcodone-acetaminophen (NORCO/VICODIN) 5-325 MG per tablet 1 tablet (1 tablet Oral Given 03/26/20 1626)  ondansetron (ZOFRAN) tablet 4 mg (4 mg Oral Given 03/26/20 1626)    ED Course  I have reviewed the triage vital signs and the nursing notes.  Pertinent labs & imaging results that were available during my care of the patient were reviewed by me and considered in my medical decision making (see chart for details).    MDM Rules/Calculators/A&P                      Normal neurological exam, no evidence of urinary incontinence or retention, pain is consistently reproducible. There is no evidence of AAA or concern for dissection at this time.   Patient can walk but states is painful.  No loss of bowel or bladder control.  No concern for cauda equina.  No fever, night sweats, weight loss, h/o cancer, IVDU.  Pain treated here in the department with adequate improvement. RICE protocol and pain medicine indicated and discussed with patient. I have also discussed reasons to return immediately to the ER.  Patient expresses understanding and agrees with plan.   Portions of  this note were generated with Scientist, clinical (histocompatibility and immunogenetics). Dictation errors may occur despite best attempts at proofreading.   Final Clinical Impression(s) / ED Diagnoses Final diagnoses:  Acute left-sided low back pain with left-sided sciatica    Rx / DC Orders ED Discharge Orders         Ordered    predniSONE (DELTASONE) 20 MG tablet  Daily     03/26/20 1609           Sherene Sires, PA-C 03/26/20 1700    Sabas Sous, MD 03/27/20 2201

## 2020-05-02 DIAGNOSIS — M5432 Sciatica, left side: Secondary | ICD-10-CM | POA: Diagnosis not present

## 2020-05-20 ENCOUNTER — Telehealth: Payer: Self-pay

## 2020-05-20 NOTE — Telephone Encounter (Signed)
Unable to speak  with patient to remind of missed remote transmission 

## 2020-05-31 ENCOUNTER — Ambulatory Visit (INDEPENDENT_AMBULATORY_CARE_PROVIDER_SITE_OTHER): Payer: PRIVATE HEALTH INSURANCE | Admitting: *Deleted

## 2020-05-31 DIAGNOSIS — I442 Atrioventricular block, complete: Secondary | ICD-10-CM

## 2020-05-31 LAB — CUP PACEART REMOTE DEVICE CHECK
Battery Impedance: 551 Ohm
Battery Remaining Longevity: 79 mo
Battery Voltage: 2.79 V
Brady Statistic AP VP Percent: 9 %
Brady Statistic AP VS Percent: 0 %
Brady Statistic AS VP Percent: 91 %
Brady Statistic AS VS Percent: 0 %
Date Time Interrogation Session: 20210621155533
Implantable Lead Implant Date: 20141110
Implantable Lead Implant Date: 20141110
Implantable Lead Location: 753859
Implantable Lead Location: 753860
Implantable Lead Model: 5076
Implantable Lead Model: 5076
Implantable Pulse Generator Implant Date: 20141110
Lead Channel Impedance Value: 408 Ohm
Lead Channel Impedance Value: 586 Ohm
Lead Channel Pacing Threshold Amplitude: 0.5 V
Lead Channel Pacing Threshold Amplitude: 0.625 V
Lead Channel Pacing Threshold Pulse Width: 0.4 ms
Lead Channel Pacing Threshold Pulse Width: 0.4 ms
Lead Channel Setting Pacing Amplitude: 2 V
Lead Channel Setting Pacing Amplitude: 2.5 V
Lead Channel Setting Pacing Pulse Width: 0.4 ms
Lead Channel Setting Sensing Sensitivity: 2.8 mV

## 2020-06-01 NOTE — Progress Notes (Signed)
Remote pacemaker transmission.   

## 2020-07-19 DIAGNOSIS — Z20822 Contact with and (suspected) exposure to covid-19: Secondary | ICD-10-CM | POA: Diagnosis not present

## 2020-07-19 DIAGNOSIS — Z6831 Body mass index (BMI) 31.0-31.9, adult: Secondary | ICD-10-CM | POA: Diagnosis not present

## 2020-10-10 ENCOUNTER — Encounter: Payer: PRIVATE HEALTH INSURANCE | Admitting: Internal Medicine

## 2020-10-18 ENCOUNTER — Ambulatory Visit (INDEPENDENT_AMBULATORY_CARE_PROVIDER_SITE_OTHER): Payer: PRIVATE HEALTH INSURANCE | Admitting: Internal Medicine

## 2020-10-18 ENCOUNTER — Encounter: Payer: Self-pay | Admitting: Internal Medicine

## 2020-10-18 ENCOUNTER — Other Ambulatory Visit: Payer: Self-pay

## 2020-10-18 VITALS — BP 114/80 | HR 89 | Ht 68.5 in | Wt 228.6 lb

## 2020-10-18 DIAGNOSIS — I10 Essential (primary) hypertension: Secondary | ICD-10-CM

## 2020-10-18 DIAGNOSIS — Z950 Presence of cardiac pacemaker: Secondary | ICD-10-CM

## 2020-10-18 DIAGNOSIS — Q246 Congenital heart block: Secondary | ICD-10-CM

## 2020-10-18 MED ORDER — AMOXICILLIN 500 MG PO TABS: ORAL_TABLET | ORAL | 0 refills | Status: DC

## 2020-10-18 NOTE — Patient Instructions (Signed)

## 2020-10-18 NOTE — Progress Notes (Signed)
HPI Jonathan Logan returns today for followup. He has a h/o CHB, s/p PPM insertion, HTN, and has done well in the interim. He has developed some cavities and is pending fillings. He denies chest pain or sob. No syncope No edema. He notes that he does get a little more tired with strenuous activity.  No Known Allergies   Current Outpatient Medications  Medication Sig Dispense Refill  . amoxicillin (AMOXIL) 500 MG capsule Take 4 capsules by mouth as needed. Prior to dental    . ibuprofen (ADVIL,MOTRIN) 200 MG tablet Take 600 mg by mouth every 6 (six) hours as needed for moderate pain.    . sildenafil (REVATIO) 20 MG tablet Take 1 tablet (20 mg total) by mouth 3 (three) times daily as needed. 100 tablet 3  . Vitamin D, Ergocalciferol, (DRISDOL) 50000 units CAPS capsule Take 1 capsule (50,000 Units total) by mouth every 7 (seven) days. 16 capsule 0  . amoxicillin (AMOXIL) 500 MG tablet Take 4 tablets by mouth 30 minutes prior to dental work 4 tablet 0   No current facility-administered medications for this visit.     Past Medical History:  Diagnosis Date  . Complete heart block ([facility])   . Congenital heart block   . H/O congenital heart block   . Mobitz type 2 second degree AV block   . Syncope     ROS:   All systems reviewed and negative except as noted in the HPI.   Past Surgical History:  Procedure Laterality Date  . PERMANENT PACEMAKER INSERTION N/A 10/19/2013   Procedure: PERMANENT PACEMAKER INSERTION;  Surgeon: Marinus Maw, MD;  [facility]: [facility] CATH LAB;  Service: Cardiovascular;  Laterality: N/A;     Family History  Problem Relation Age of Onset  . Diabetes Mother   . Hypertension Mother   . Healthy Sister   . Healthy Brother   . Healthy Brother   . Healthy Brother   . Healthy Daughter   . Healthy Son   . Healthy Daughter   . Premature birth Daughter   . Healthy Son   . Healthy Son   . Healthy Son      Social History   Socioeconomic History  .  Marital status: Married    Spouse name: Not on file  . Number of children: Not on file  . Years of education: Not on file  . Highest education level: Not on file  Occupational History  . Not on file  Tobacco Use  . Smoking status: Former Smoker    Packs/day: 0.05    Types: Cigarettes    Quit date: 12/10/2013    Years since quitting: 6.8  . Smokeless tobacco: Never Used  . Tobacco comment: VERY LIGHT, OCCASIONAL SMOKER  Vaping Use  . Vaping Use: Never used  Substance and Sexual Activity  . Alcohol use: Yes    Alcohol/week: 0.0 standard drinks    Comment: occassionally  . Drug use: No  . Sexual activity: Yes    Birth control/protection: None  Other Topics Concern  . Not on file  Social History Narrative  . Not on file   Social Determinants of Health   Financial Resource Strain:   . Difficulty of Paying Living Expenses: Not on file  Food Insecurity:   . Worried About Programme researcher, broadcasting/film/video in the Last Year: Not on file  . Ran Out of Food in the Last Year: Not on file  Transportation Needs:   . Lack  of Transportation (Medical): Not on file  . Lack of Transportation (Non-Medical): Not on file  Physical Activity:   . Days of Exercise per Week: Not on file  . Minutes of Exercise per Session: Not on file  Stress:   . Feeling of Stress : Not on file  Social Connections:   . Frequency of Communication with Friends and Family: Not on file  . Frequency of Social Gatherings with Friends and Family: Not on file  . Attends Religious Services: Not on file  . Active Member of Clubs or Organizations: Not on file  . Attends Banker Meetings: Not on file  . Marital Status: Not on file  Intimate Partner Violence:   . Fear of Current or Ex-Partner: Not on file  . Emotionally Abused: Not on file  . Physically Abused: Not on file  . Sexually Abused: Not on file     BP 114/80   Pulse 89   Ht 5' 8.5" (1.74 m)   Wt 228 lb 9.6 oz (103.7 kg)   SpO2 95%   BMI 34.25 kg/m    Physical Exam:  Well appearing NAD HEENT: Unremarkable Neck:  No JVD, no thyromegally Lymphatics:  No adenopathy Back:  No CVA tenderness Lungs:  Clear with no wheezes HEART:  Regular rate rhythm, no murmurs, no rubs, no clicks Abd:  soft, positive bowel sounds, no organomegally, no rebound, no guarding Ext:  2 plus pulses, no edema, no cyanosis, no clubbing Skin:  No rashes no nodules Neuro:  CN II through XII intact, motor grossly intact  EKG  - nsr with ventricular pacing  DEVICE  Normal device function.  See PaceArt for details.   Assess/Plan: 1. CHB - He is asymptomatic,s/p PPM insertion. He has no escape.  2. PPM - his medtronic DDD PM is workingnormally. He has 6 years of battery longevity. 3. HTN - his bp is now well controlled and he has stopped taking norvasc. 4. Preoperative eval - he is pending dental fillings. I recommended he be given amoxil 2 gm 30 min prior to his procedure and a prescription has been included.  Sharlot Gowda Sarahanne Novakowski,MD

## 2020-12-13 ENCOUNTER — Ambulatory Visit (INDEPENDENT_AMBULATORY_CARE_PROVIDER_SITE_OTHER): Payer: PRIVATE HEALTH INSURANCE

## 2020-12-13 DIAGNOSIS — I442 Atrioventricular block, complete: Secondary | ICD-10-CM

## 2020-12-13 LAB — CUP PACEART REMOTE DEVICE CHECK
Battery Impedance: 679 Ohm
Battery Remaining Longevity: 72 mo
Battery Voltage: 2.78 V
Brady Statistic AP VP Percent: 6 %
Brady Statistic AP VS Percent: 0 %
Brady Statistic AS VP Percent: 94 %
Brady Statistic AS VS Percent: 0 %
Date Time Interrogation Session: 20220104141313
Implantable Lead Implant Date: 20141110
Implantable Lead Implant Date: 20141110
Implantable Lead Location: 753859
Implantable Lead Location: 753860
Implantable Lead Model: 5076
Implantable Lead Model: 5076
Implantable Pulse Generator Implant Date: 20141110
Lead Channel Impedance Value: 392 Ohm
Lead Channel Impedance Value: 563 Ohm
Lead Channel Pacing Threshold Amplitude: 0.5 V
Lead Channel Pacing Threshold Amplitude: 0.75 V
Lead Channel Pacing Threshold Pulse Width: 0.4 ms
Lead Channel Pacing Threshold Pulse Width: 0.4 ms
Lead Channel Setting Pacing Amplitude: 2 V
Lead Channel Setting Pacing Amplitude: 2.5 V
Lead Channel Setting Pacing Pulse Width: 0.4 ms
Lead Channel Setting Sensing Sensitivity: 2.8 mV

## 2020-12-28 DIAGNOSIS — Z20822 Contact with and (suspected) exposure to covid-19: Secondary | ICD-10-CM | POA: Diagnosis not present

## 2020-12-28 DIAGNOSIS — J069 Acute upper respiratory infection, unspecified: Secondary | ICD-10-CM | POA: Diagnosis not present

## 2020-12-28 NOTE — Progress Notes (Signed)
Remote pacemaker transmission.   

## 2021-02-01 ENCOUNTER — Other Ambulatory Visit: Payer: PRIVATE HEALTH INSURANCE

## 2021-03-14 ENCOUNTER — Ambulatory Visit: Payer: PRIVATE HEALTH INSURANCE

## 2021-03-16 LAB — CUP PACEART REMOTE DEVICE CHECK
Battery Impedance: 754 Ohm
Battery Remaining Longevity: 68 mo
Battery Voltage: 2.78 V
Brady Statistic AP VP Percent: 7 %
Brady Statistic AP VS Percent: 0 %
Brady Statistic AS VP Percent: 93 %
Brady Statistic AS VS Percent: 0 %
Date Time Interrogation Session: 20220406171338
Implantable Lead Implant Date: 20141110
Implantable Lead Implant Date: 20141110
Implantable Lead Location: 753859
Implantable Lead Location: 753860
Implantable Lead Model: 5076
Implantable Lead Model: 5076
Implantable Pulse Generator Implant Date: 20141110
Lead Channel Impedance Value: 392 Ohm
Lead Channel Impedance Value: 584 Ohm
Lead Channel Pacing Threshold Amplitude: 0.5 V
Lead Channel Pacing Threshold Amplitude: 0.5 V
Lead Channel Pacing Threshold Pulse Width: 0.4 ms
Lead Channel Pacing Threshold Pulse Width: 0.4 ms
Lead Channel Setting Pacing Amplitude: 2 V
Lead Channel Setting Pacing Amplitude: 2.5 V
Lead Channel Setting Pacing Pulse Width: 0.4 ms
Lead Channel Setting Sensing Sensitivity: 2.8 mV

## 2021-06-29 DIAGNOSIS — M5416 Radiculopathy, lumbar region: Secondary | ICD-10-CM | POA: Diagnosis not present

## 2021-06-29 DIAGNOSIS — M545 Low back pain, unspecified: Secondary | ICD-10-CM | POA: Diagnosis not present

## 2021-09-12 ENCOUNTER — Ambulatory Visit (INDEPENDENT_AMBULATORY_CARE_PROVIDER_SITE_OTHER): Payer: PRIVATE HEALTH INSURANCE

## 2021-09-12 DIAGNOSIS — I442 Atrioventricular block, complete: Secondary | ICD-10-CM | POA: Diagnosis not present

## 2021-09-18 LAB — CUP PACEART REMOTE DEVICE CHECK
Battery Impedance: 859 Ohm
Battery Remaining Longevity: 63 mo
Battery Voltage: 2.78 V
Brady Statistic AP VP Percent: 7 %
Brady Statistic AP VS Percent: 0 %
Brady Statistic AS VP Percent: 93 %
Brady Statistic AS VS Percent: 0 %
Date Time Interrogation Session: 20221009074443
Implantable Lead Implant Date: 20141110
Implantable Lead Implant Date: 20141110
Implantable Lead Location: 753859
Implantable Lead Location: 753860
Implantable Lead Model: 5076
Implantable Lead Model: 5076
Implantable Pulse Generator Implant Date: 20141110
Lead Channel Impedance Value: 387 Ohm
Lead Channel Impedance Value: 567 Ohm
Lead Channel Pacing Threshold Amplitude: 0.5 V
Lead Channel Pacing Threshold Amplitude: 0.875 V
Lead Channel Pacing Threshold Pulse Width: 0.4 ms
Lead Channel Pacing Threshold Pulse Width: 0.4 ms
Lead Channel Setting Pacing Amplitude: 2 V
Lead Channel Setting Pacing Amplitude: 2.5 V
Lead Channel Setting Pacing Pulse Width: 0.4 ms
Lead Channel Setting Sensing Sensitivity: 2.8 mV

## 2021-09-20 NOTE — Progress Notes (Signed)
Remote pacemaker transmission.   

## 2021-10-19 NOTE — Progress Notes (Signed)
Electrophysiology Office Note Date: 10/20/2021  ID:  Jonathan Logan, DOB [birth year], MRN [id number]  PCP: No primary care provider on file. Primary Cardiologist: None Electrophysiologist: Lewayne Bunting, MD   CC: Pacemaker follow-up  Jonathan Logan is a 45 y.o. male seen today for Lewayne Bunting, MD for routine electrophysiology followup.  Since last being seen in our clinic the patient reports doing very well.  he denies chest pain, palpitations, dyspnea, PND, orthopnea, nausea, vomiting, dizziness, syncope, edema, weight gain, or early satiety.  Device History: Medtronic Dual Chamber PPM implanted 2014 for CHB  Past Medical History:  Diagnosis Date   Complete heart block ([facility])    Congenital heart block    H/O congenital heart block    Mobitz type 2 second degree AV block    Syncope    Past Surgical History:  Procedure Laterality Date   PERMANENT PACEMAKER INSERTION N/A 10/19/2013   Procedure: PERMANENT PACEMAKER INSERTION;  Surgeon: Marinus Maw, MD;  Location: [facility] CATH LAB;  Service: Cardiovascular;  Laterality: N/A;    Current Outpatient Medications  Medication Sig Dispense Refill   amoxicillin (AMOXIL) 500 MG capsule Take 4 capsules by mouth as needed. Prior to dental (Patient not taking: Reported on 10/20/2021)     amoxicillin (AMOXIL) 500 MG tablet Take 4 tablets by mouth 30 minutes prior to dental work (Patient not taking: Reported on 10/20/2021) 4 tablet 0   ibuprofen (ADVIL,MOTRIN) 200 MG tablet Take 600 mg by mouth every 6 (six) hours as needed for moderate pain. (Patient not taking: Reported on 10/20/2021)     sildenafil (REVATIO) 20 MG tablet Take 1 tablet (20 mg total) by mouth 3 (three) times daily as needed. (Patient not taking: Reported on 10/20/2021) 100 tablet 3   Vitamin D, Ergocalciferol, (DRISDOL) 50000 units CAPS capsule Take 1 capsule (50,000 Units total) by mouth every 7 (seven) days. (Patient not taking: Reported on 10/20/2021) 16 capsule 0    No current facility-administered medications for this visit.    Allergies:   Patient has no known allergies.   Social History: Social History   Socioeconomic History   Marital status: Married    Spouse name: Not on file   Number of children: Not on file   Years of education: Not on file   Highest education level: Not on file  Occupational History   Not on file  Tobacco Use   Smoking status: Former    Packs/day: 0.05    Types: Cigarettes    Quit date: 12/10/2013    Years since quitting: 7.8   Smokeless tobacco: Never   Tobacco comments:    VERY LIGHT, OCCASIONAL SMOKER  Vaping Use   Vaping Use: Never used  Substance and Sexual Activity   Alcohol use: Yes    Alcohol/week: 0.0 standard drinks    Comment: occassionally   Drug use: No   Sexual activity: Yes    Birth control/protection: None  Other Topics Concern   Not on file  Social History Narrative   Not on file   Social Determinants of Health   Financial Resource Strain: Not on file  Food Insecurity: Not on file  Transportation Needs: Not on file  Physical Activity: Not on file  Stress: Not on file  Social Connections: Not on file  Intimate Partner Violence: Not on file    Family History: Family History  Problem Relation Age of Onset   Diabetes Mother    Hypertension Mother    Healthy Sister  Healthy Brother    Healthy Brother    Healthy Brother    Healthy Daughter    Healthy Son    Healthy Daughter    Premature birth Daughter    Healthy Son    Healthy Son    Healthy Son      Review of Systems: All other systems reviewed and are otherwise negative except as noted above.  Physical Exam: Vitals:   10/20/21 1205  BP: 118/78  Pulse: 74  SpO2: 98%  Weight: 282 lb 3.2 oz (128 kg)  Height: 5' 8.5" (1.74 m)     GEN- The patient is well appearing, alert and oriented x 3 today.   HEENT: normocephalic, atraumatic; sclera clear, conjunctiva pink; hearing intact; oropharynx clear; neck supple   Lungs- Clear to ausculation bilaterally, normal work of breathing.  No wheezes, rales, rhonchi Heart- Regular rate and rhythm, no murmurs, rubs or gallops  GI- soft, non-tender, non-distended, bowel sounds present  Extremities- no clubbing or cyanosis. No edema MS- no significant deformity or atrophy Skin- warm and dry, no rash or lesion; PPM pocket well healed Psych- euthymic mood, full affect Neuro- strength and sensation are intact  PPM Interrogation- reviewed in detail today,  See PACEART report  EKG:  EKG is ordered today. Personal review of ekg ordered today shows NSR at 74 bpm, prominent T waves in anterior leads stable from 10/2020   Recent Labs: No results found for requested labs within last 8760 hours.   Wt Readings from Last 3 Encounters:  10/20/21 282 lb 3.2 oz (128 kg)  10/18/20 228 lb 9.6 oz (103.7 kg)  03/26/20 190 lb (86.2 kg)     Other studies Reviewed: Additional studies/ records that were reviewed today include: Previous EP office notes, Previous remote checks, Most recent labwork.   Assessment and Plan:  1. CHB s/p Medtronic PPM  Normal PPM function See Pace Art report No changes today  2. HTN Controlled currently with diet and exercise.   Current medicines are reviewed at length with the patient today.    Labs/ tests ordered today include:  Orders Placed This Encounter  Procedures   EKG 12-Lead   Disposition:   Follow up with Dr. Ladona Ridgel in 12 months    Signed, Graciella Freer, PA-C  10/20/2021 12:27 PM  [facility] [address] [place] [address] [phone] (office) [phone] (fax)

## 2021-10-20 ENCOUNTER — Other Ambulatory Visit: Payer: Self-pay

## 2021-10-20 ENCOUNTER — Ambulatory Visit (INDEPENDENT_AMBULATORY_CARE_PROVIDER_SITE_OTHER): Payer: PRIVATE HEALTH INSURANCE | Admitting: Student

## 2021-10-20 ENCOUNTER — Encounter: Payer: Self-pay | Admitting: Student

## 2021-10-20 VITALS — BP 118/78 | HR 74 | Ht 68.5 in | Wt 282.2 lb

## 2021-10-20 DIAGNOSIS — I442 Atrioventricular block, complete: Secondary | ICD-10-CM

## 2021-10-20 DIAGNOSIS — I10 Essential (primary) hypertension: Secondary | ICD-10-CM | POA: Diagnosis not present

## 2021-10-20 LAB — CUP PACEART INCLINIC DEVICE CHECK
Battery Impedance: 910 Ohm
Battery Remaining Longevity: 62 mo
Battery Voltage: 2.78 V
Brady Statistic AP VP Percent: 7 %
Brady Statistic AP VS Percent: 0 %
Brady Statistic AS VP Percent: 93 %
Brady Statistic AS VS Percent: 0 %
Date Time Interrogation Session: 20221111122627
Implantable Lead Implant Date: 20141110
Implantable Lead Implant Date: 20141110
Implantable Lead Location: 753859
Implantable Lead Location: 753860
Implantable Lead Model: 5076
Implantable Lead Model: 5076
Implantable Pulse Generator Implant Date: 20141110
Lead Channel Impedance Value: 403 Ohm
Lead Channel Impedance Value: 612 Ohm
Lead Channel Pacing Threshold Amplitude: 0.5 V
Lead Channel Pacing Threshold Amplitude: 0.625 V
Lead Channel Pacing Threshold Amplitude: 0.75 V
Lead Channel Pacing Threshold Amplitude: 1 V
Lead Channel Pacing Threshold Pulse Width: 0.4 ms
Lead Channel Pacing Threshold Pulse Width: 0.4 ms
Lead Channel Pacing Threshold Pulse Width: 0.4 ms
Lead Channel Pacing Threshold Pulse Width: 0.4 ms
Lead Channel Sensing Intrinsic Amplitude: 4 mV
Lead Channel Setting Pacing Amplitude: 2 V
Lead Channel Setting Pacing Amplitude: 2.5 V
Lead Channel Setting Pacing Pulse Width: 0.4 ms
Lead Channel Setting Sensing Sensitivity: 2.8 mV

## 2021-11-21 DIAGNOSIS — M5416 Radiculopathy, lumbar region: Secondary | ICD-10-CM | POA: Diagnosis not present

## 2021-12-21 ENCOUNTER — Ambulatory Visit (INDEPENDENT_AMBULATORY_CARE_PROVIDER_SITE_OTHER): Payer: PRIVATE HEALTH INSURANCE

## 2021-12-21 DIAGNOSIS — I442 Atrioventricular block, complete: Secondary | ICD-10-CM

## 2021-12-21 LAB — CUP PACEART REMOTE DEVICE CHECK
Battery Impedance: 989 Ohm
Battery Remaining Longevity: 58 mo
Battery Voltage: 2.78 V
Brady Statistic AP VP Percent: 5 %
Brady Statistic AP VS Percent: 0 %
Brady Statistic AS VP Percent: 95 %
Brady Statistic AS VS Percent: 0 %
Date Time Interrogation Session: 20230112032608
Implantable Lead Implant Date: 20141110
Implantable Lead Implant Date: 20141110
Implantable Lead Location: 753859
Implantable Lead Location: 753860
Implantable Lead Model: 5076
Implantable Lead Model: 5076
Implantable Pulse Generator Implant Date: 20141110
Lead Channel Impedance Value: 387 Ohm
Lead Channel Impedance Value: 563 Ohm
Lead Channel Pacing Threshold Amplitude: 0.625 V
Lead Channel Pacing Threshold Amplitude: 1 V
Lead Channel Pacing Threshold Pulse Width: 0.4 ms
Lead Channel Pacing Threshold Pulse Width: 0.4 ms
Lead Channel Setting Pacing Amplitude: 2 V
Lead Channel Setting Pacing Amplitude: 2.5 V
Lead Channel Setting Pacing Pulse Width: 0.4 ms
Lead Channel Setting Sensing Sensitivity: 2.8 mV

## 2021-12-27 ENCOUNTER — Ambulatory Visit
Admission: EM | Admit: 2021-12-27 | Discharge: 2021-12-27 | Disposition: A | Discharge status: Home / Self Care | Payer: PRIVATE HEALTH INSURANCE | Attending: Internal Medicine | Admitting: Internal Medicine

## 2021-12-27 ENCOUNTER — Other Ambulatory Visit: Payer: Self-pay

## 2021-12-27 DIAGNOSIS — J029 Acute pharyngitis, unspecified: Secondary | ICD-10-CM | POA: Insufficient documentation

## 2021-12-27 DIAGNOSIS — J069 Acute upper respiratory infection, unspecified: Secondary | ICD-10-CM | POA: Insufficient documentation

## 2021-12-27 LAB — POCT RAPID STREP A (OFFICE): Rapid Strep A Screen: NEGATIVE

## 2021-12-27 MED ORDER — AMOXICILLIN-POT CLAVULANATE 875-125 MG PO TABS: 1.0000 | ORAL_TABLET | Freq: Two times a day (BID) | ORAL | 0 refills | Status: DC

## 2021-12-27 MED ORDER — BENZONATATE 100 MG PO CAPS: 100.0000 mg | ORAL_CAPSULE | Freq: Three times a day (TID) | ORAL | 0 refills | Status: DC | PRN

## 2021-12-27 NOTE — ED Provider Notes (Signed)
[facility]    CSN: [id number] Arrival date & time: 12/27/21  1449      History   Chief Complaint Chief Complaint  Patient presents with   dry mouth    HPI GIAN YBARRA is a 46 y.o. male.   Patient presents with 1.5-week history of nasal congestion, nonproductive cough, sore throat.  He reports that he was exposed to strep throat at work.  Denies any known fevers.  He has been having some shortness of breath given his nasal congestion but denies shortness of breath coming from his chest.  He also reports an episode of blurry vision that lasted just a few minutes last night in the middle the night but attributes this to pressure in his face due to nasal congestion.  Denies any drainage from the eyes.  Patient does not wear contacts.  Denies any trauma or foreign bodies to the eye.  Has taken a few over-the-counter cough and cold medications with minimal improvement in symptoms.    Past Medical History:  Diagnosis Date   Complete heart block (HCC)    Congenital heart block    H/O congenital heart block    Mobitz type 2 second degree AV block    Syncope     Patient Active Problem List   Diagnosis Date Noted   Erectile dysfunction 09/23/2019   Itchy scalp 02/26/2018   HTN, goal below 140/90 07/29/2017   Health care maintenance 07/29/2017   Back pain of lumbar region with sciatica 07/29/2017   Pacemaker 02/19/2014   Complete heart block (HCC) 10/18/2013   Syncope 10/18/2013   Congenital heart block 10/18/2013    Past Surgical History:  Procedure Laterality Date   PERMANENT PACEMAKER INSERTION N/A 10/19/2013   Procedure: PERMANENT PACEMAKER INSERTION;  Surgeon: Marinus Maw, MD;  Location: [facility] CATH LAB;  Service: Cardiovascular;  Laterality: N/A;       Home Medications    Prior to Admission medications   Medication Sig Start Date End Date Taking? Authorizing Provider  amoxicillin-clavulanate (AUGMENTIN) 875-125 MG tablet Take 1 tablet by mouth  every 12 (twelve) hours. 12/27/21  Yes Desirae Mancusi, Rolly Salter E, FNP  benzonatate (TESSALON) 100 MG capsule Take 1 capsule (100 mg total) by mouth every 8 (eight) hours as needed for cough. 12/27/21  Yes Lachrisha Ziebarth, Rolly Salter E, FNP  ibuprofen (ADVIL,MOTRIN) 200 MG tablet Take 600 mg by mouth every 6 (six) hours as needed for moderate pain. Patient not taking: Reported on 10/20/2021    [provider]  sildenafil (REVATIO) 20 MG tablet Take 1 tablet (20 mg total) by mouth 3 (three) times daily as needed. Patient not taking: Reported on 10/20/2021 09/23/19   Graciella Freer, PA-C  Vitamin D, Ergocalciferol, (DRISDOL) 50000 units CAPS capsule Take 1 capsule (50,000 Units total) by mouth every 7 (seven) days. Patient not taking: Reported on 10/20/2021 09/03/17   Julaine Fusi, NP    Family History Family History  Problem Relation Age of Onset   Diabetes Mother    Hypertension Mother    Healthy Sister    Healthy Brother    Healthy Brother    Healthy Brother    Healthy Daughter    Healthy Son    Healthy Daughter    Premature birth Daughter    Healthy Son    Healthy Son    Healthy Son     Social History Social History   Tobacco Use   Smoking status: Former    Packs/day: 0.05  Types: Cigarettes    Quit date: 12/10/2013    Years since quitting: 8.0   Smokeless tobacco: Never   Tobacco comments:    VERY LIGHT, OCCASIONAL SMOKER  Vaping Use   Vaping Use: Never used  Substance Use Topics   Alcohol use: Yes    Alcohol/week: 0.0 standard drinks    Comment: occassionally   Drug use: No     Allergies   Patient has no known allergies.   Review of Systems Review of Systems Per HPI  Physical Exam Triage Vital Signs ED Triage Vitals [12/27/21 1459]  Enc Vitals Group     BP (!) 167/98     Pulse Rate 81     Resp 18     Temp 97.9 F (36.6 C)     Temp Source Oral     SpO2 96 %     Weight      Height      Head Circumference      Peak Flow      Pain Score 0     Pain Loc       Pain Edu?      Excl. in GC?    No data found.  Updated Vital Signs BP (!) 167/98 (BP Location: Right Arm)    Pulse 81    Temp 97.9 F (36.6 C) (Oral)    Resp 18    SpO2 96%   Visual Acuity Right Eye Distance:   Left Eye Distance:   Bilateral Distance:    Right Eye Near:   Left Eye Near:    Bilateral Near:     Physical Exam Constitutional:      General: He is not in acute distress.    Appearance: Normal appearance. He is not toxic-appearing or diaphoretic.  HENT:     Head: Normocephalic and atraumatic.     Right Ear: Tympanic membrane and ear canal normal.     Left Ear: Tympanic membrane and ear canal normal.     Nose: Congestion present.     Mouth/Throat:     Mouth: Mucous membranes are moist.     Pharynx: Posterior oropharyngeal erythema present.  Eyes:     General: Lids are normal. Lids are everted, no foreign bodies appreciated. Vision grossly intact. Gaze aligned appropriately.        Right eye: No foreign body, discharge or hordeolum.        Left eye: No foreign body, discharge or hordeolum.     Extraocular Movements: Extraocular movements intact.     Conjunctiva/sclera: Conjunctivae normal.     Pupils: Pupils are equal, round, and reactive to light.  Cardiovascular:     Rate and Rhythm: Normal rate and regular rhythm.     Pulses: Normal pulses.     Heart sounds: Normal heart sounds.  Pulmonary:     Effort: Pulmonary effort is normal. No respiratory distress.     Breath sounds: Normal breath sounds. No stridor. No wheezing, rhonchi or rales.  Abdominal:     General: Abdomen is flat. Bowel sounds are normal.     Palpations: Abdomen is soft.  Musculoskeletal:        General: Normal range of motion.     Cervical back: Normal range of motion.  Skin:    General: Skin is warm and dry.  Neurological:     General: No focal deficit present.     Mental Status: He is alert and oriented to person, place, and time. Mental status is at baseline.  Psychiatric:         Mood and Affect: Mood normal.        Behavior: Behavior normal.     UC Treatments / Results  Labs (all labs ordered are listed, but only abnormal results are displayed) Labs Reviewed  COVID-19, FLU A+B NAA  CULTURE, GROUP A STREP [facility])  POCT RAPID STREP A (OFFICE)    EKG   Radiology No results found.  Procedures Procedures (including critical care time)  Medications Ordered in UC Medications - No data to display  Initial Impression / Assessment and Plan / UC Course  I have reviewed the triage vital signs and the nursing notes.  Pertinent labs & imaging results that were available during my care of the patient were reviewed by me and considered in my medical decision making (see chart for details).     Rapid strep test negative.  It is likely that patient could have strep throat given patient having close exposure.  Will opt to treat with augmentin antibiotic.  Throat culture is pending.  COVID-19 and flu test pending to rule out viral etiology, although this may not be accurate given duration of symptoms.  Do not think that chest imaging is necessary given no adventitious lung sounds on exam, no shortness of breath, oxygen is normal.  Benzonatate prescribed to take as needed for cough.  There are no obvious abnormalities on physical that could have caused patient's blurred vision.  Patient was advised to go to the hospital for further evaluation and management but declined.  Risks associated with not going to hospital for further evaluation of blurred vision were discussed with patient.  Patient voiced understanding.  Advised patient to go to the hospital if symptoms persist or worsen.  Patient verbalized understanding and was agreeable with plan. Final Clinical Impressions(s) / UC Diagnoses   Final diagnoses:  Acute upper respiratory infection  Sore throat     Discharge Instructions      You have been prescribed an antibiotic to treat possible strep throat given your  exposure.  Your rapid strep was negative.  Throat culture, COVID-19, flu test is pending.  We will call if it is positive.  Please go to the hospital if symptoms persist or worsen.    ED Prescriptions     Medication Sig Dispense Auth. Provider   amoxicillin-clavulanate (AUGMENTIN) 875-125 MG tablet Take 1 tablet by mouth every 12 (twelve) hours. 14 tablet [place], [place] Porte[place]aley E, [place]   benzonatate (TESSALON) 100 MG capsule Take 1 capsule (100 mg total) by mouth every 8 (eight) hours as needed for cough. 21 capsule [place], Acie FredricksonHaley E, [place]FNP      PDMP not reviewed this encounter.   Gustavus BryantMound, Kikue Gerhart E, [place]FNP 12/27/21 [phone]

## 2021-12-27 NOTE — Discharge Instructions (Addendum)
You have been prescribed an antibiotic to treat possible strep throat given your exposure.  Your rapid strep was negative.  Throat culture, COVID-19, flu test is pending.  We will call if it is positive.  Please go to the hospital if symptoms persist or worsen.

## 2021-12-27 NOTE — ED Triage Notes (Signed)
Pt c/o dry mouth, SOB pt states r/t nasal congestion, cough, sore throat,   Denies earache, headache, body aches or chills, nausea, vomiting, diarrhea, constipation   Onset ~ 1.5 weeks

## 2021-12-28 LAB — COVID-19, FLU A+B NAA
Influenza A, NAA: NOT DETECTED
Influenza B, NAA: NOT DETECTED
SARS-CoV-2, NAA: NOT DETECTED

## 2021-12-30 LAB — CULTURE, GROUP A STREP (THRC)

## 2022-01-02 NOTE — Progress Notes (Signed)
Remote pacemaker transmission.   

## 2022-01-18 ENCOUNTER — Encounter: Payer: Self-pay | Admitting: Nurse Practitioner

## 2022-01-18 ENCOUNTER — Other Ambulatory Visit: Payer: Self-pay

## 2022-01-18 ENCOUNTER — Ambulatory Visit (INDEPENDENT_AMBULATORY_CARE_PROVIDER_SITE_OTHER): Payer: BC Managed Care – PPO | Admitting: Nurse Practitioner

## 2022-01-18 VITALS — BP 130/70 | HR 96 | Temp 98.0°F | Ht 68.0 in | Wt 282.8 lb

## 2022-01-18 DIAGNOSIS — Q246 Congenital heart block: Secondary | ICD-10-CM

## 2022-01-18 DIAGNOSIS — Z7689 Persons encountering health services in other specified circumstances: Secondary | ICD-10-CM

## 2022-01-18 DIAGNOSIS — M5440 Lumbago with sciatica, unspecified side: Secondary | ICD-10-CM

## 2022-01-18 DIAGNOSIS — Z6841 Body Mass Index (BMI) 40.0 and over, adult: Secondary | ICD-10-CM

## 2022-01-18 NOTE — Progress Notes (Signed)
New Patient Office Visit  Subjective:  Patient ID: Jonathan Logan, male    DOB: 01-12-1976  Age: 46 y.o. MRN: [id number]  CC:  Chief Complaint  Patient presents with   New Patient (Initial Visit)    HPI Jonathan Logan presents to reestablish care with primary care provider.  He does have congenital heart block. Does have a permanent pacemaker. Sees cardiology routinely Had fall at work about a month ago. Injured his tailbone. Did contact safety team at work. Was on self-care at home. Still bothering him. Has returned to work.  He has seen Workmen's Comp. provider through his employer. Patient is due for routine blood work and physical exam. Patient has no other concerns or complaints today.   Past Medical History:  Diagnosis Date   Complete heart block (HCC)    Congenital heart block    H/O congenital heart block    Mobitz type 2 second degree AV block    Syncope     Past Surgical History:  Procedure Laterality Date   PERMANENT PACEMAKER INSERTION N/A 10/19/2013   Procedure: PERMANENT PACEMAKER INSERTION;  Surgeon: Marinus Maw, MD;  Location: [facility] CATH LAB;  Service: Cardiovascular;  Laterality: N/A;    Family History  Problem Relation Age of Onset   Diabetes Mother    Hypertension Mother    Healthy Sister    Healthy Brother    Healthy Brother    Healthy Brother    Healthy Daughter    Healthy Son    Healthy Daughter    Premature birth Daughter    Healthy Son    Healthy Son    Healthy Son     Social History   Socioeconomic History   Marital status: Married    Spouse name: Not on file   Number of children: Not on file   Years of education: Not on file   Highest education level: Not on file  Occupational History   Not on file  Tobacco Use   Smoking status: Former    Packs/day: 0.05    Types: Cigarettes    Quit date: 12/10/2013    Years since quitting: 8.1   Smokeless tobacco: Never   Tobacco comments:    VERY LIGHT, OCCASIONAL SMOKER  Vaping  Use   Vaping Use: Never used  Substance and Sexual Activity   Alcohol use: Never    Comment: occassionally   Drug use: No   Sexual activity: Yes    Birth control/protection: None  Other Topics Concern   Not on file  Social History Narrative   Not on file   Social Determinants of Health   Financial Resource Strain: Not on file  Food Insecurity: Not on file  Transportation Needs: Not on file  Physical Activity: Not on file  Stress: Not on file  Social Connections: Not on file  Intimate Partner Violence: Not on file    ROS Review of Systems  Constitutional:  Negative for activity change, chills, fatigue and fever.  HENT:  Negative for congestion, postnasal drip, rhinorrhea, sinus pressure, sinus pain, sneezing and sore throat.   Eyes: Negative.   Respiratory:  Negative for cough, shortness of breath and wheezing.   Cardiovascular:  Negative for chest pain and palpitations.       Patient has history of third-degree AV block.  He states this is congenital disorder.  Has a permanent pacemaker in place.  Gastrointestinal:  Negative for constipation, diarrhea, nausea and vomiting.  Endocrine: Negative for cold intolerance,  heat intolerance, polydipsia and polyuria.  Genitourinary:  Negative for dysuria, frequency and urgency.  Musculoskeletal:  Positive for arthralgias and joint swelling. Negative for back pain and myalgias.  Skin:  Negative for rash.  Allergic/Immunologic: Negative for environmental allergies.  Neurological:  Negative for dizziness, weakness and headaches.  Psychiatric/Behavioral:  The patient is not nervous/anxious.    Objective:   Today's Vitals   01/18/22 1124 01/18/22 1142  BP: 140/71 130/70  Pulse: 96   Temp: 98 F (36.7 C)   SpO2: 96%   Weight: 282 lb 12.8 oz (128.3 kg)   Height: 5\' 8"  (1.727 m)    Body mass index is 43 kg/m.   Physical Exam Vitals and nursing note reviewed.  Constitutional:      Appearance: Normal appearance. He is  well-developed.  HENT:     Head: Normocephalic and atraumatic.  Eyes:     Pupils: Pupils are equal, round, and reactive to light.  Cardiovascular:     Rate and Rhythm: Normal rate and regular rhythm.     Pulses: Normal pulses.     Heart sounds: Normal heart sounds.  Pulmonary:     Effort: Pulmonary effort is normal.     Breath sounds: Normal breath sounds.  Abdominal:     Palpations: Abdomen is soft.  Musculoskeletal:        General: Normal range of motion.     Cervical back: Normal range of motion and neck supple.     Comments: Tenderness of the low back/tailbone.  Patient does have a difficult time finding comfortable seated position.  Gait intact.  Lymphadenopathy:     Cervical: No cervical adenopathy.  Skin:    General: Skin is warm and dry.     Capillary Refill: Capillary refill takes less than 2 seconds.  Neurological:     General: No focal deficit present.     Mental Status: He is alert and oriented to person, place, and time.  Psychiatric:        Mood and Affect: Mood normal.        Behavior: Behavior normal.        Thought Content: Thought content normal.        Judgment: Judgment normal.    Assessment & Plan:  1. Encounter to establish care Appointment today to establish new primary care provider    2. Congenital heart block Stable.  Continue regular visits with cardiology as scheduled.  3. Back pain of lumbar region with sciatica Recommend patient follow-up with Workmen's Comp. providers.  4. Body mass index (BMI) of 40.1-44.9 in adult [facility]) Encourage patient to limit calorie intake to 2000 cal/day or less.  He should consume a low cholesterol, low-fat diet.  Recommend he incorporate exercise into his daily routine.    Problem List Items Addressed This Visit       Cardiovascular and Mediastinum   Congenital heart block     Nervous and Auditory   Back pain of lumbar region with sciatica     Other   Body mass index (BMI) of 40.1-44.9 in adult [facility])    Other Visit Diagnoses     Encounter to establish care    -  Primary       Outpatient Encounter Medications as of 01/18/2022  Medication Sig   ibuprofen (ADVIL,MOTRIN) 200 MG tablet Take 600 mg by mouth every 6 (six) hours as needed for moderate pain. (Patient not taking: Reported on 10/20/2021)   [DISCONTINUED] amoxicillin-clavulanate (AUGMENTIN) 875-125 MG tablet Take 1  tablet by mouth every 12 (twelve) hours.   [DISCONTINUED] benzonatate (TESSALON) 100 MG capsule Take 1 capsule (100 mg total) by mouth every 8 (eight) hours as needed for cough.   [DISCONTINUED] sildenafil (REVATIO) 20 MG tablet Take 1 tablet (20 mg total) by mouth 3 (three) times daily as needed. (Patient not taking: Reported on 10/20/2021)   [DISCONTINUED] Vitamin D, Ergocalciferol, (DRISDOL) 50000 units CAPS capsule Take 1 capsule (50,000 Units total) by mouth every 7 (seven) days. (Patient not taking: Reported on 10/20/2021)   No facility-administered encounter medications on file as of 01/18/2022.    Follow-up: Return in about 6 weeks (around 03/01/2022) for health maintenance exam, FBW a week prior to visit.   Carlean Jews, NP  This note was dictated using Conservation officer, historic buildings. Rapid proofreading was performed to expedite the delivery of the information. Despite proofreading, phonetic errors will occur which are common with this voice recognition software. Please take this into consideration. If there are any concerns, please contact our office.

## 2022-01-27 DIAGNOSIS — Z6841 Body Mass Index (BMI) 40.0 and over, adult: Secondary | ICD-10-CM | POA: Insufficient documentation

## 2022-02-27 ENCOUNTER — Other Ambulatory Visit: Payer: Self-pay

## 2022-02-27 DIAGNOSIS — Z0000 Encounter for general adult medical examination without abnormal findings: Secondary | ICD-10-CM

## 2022-02-27 DIAGNOSIS — Z130 Encounter for screening for diseases of the blood and blood-forming organs and certain disorders involving the immune mechanism: Secondary | ICD-10-CM

## 2022-03-02 ENCOUNTER — Other Ambulatory Visit: Payer: Self-pay

## 2022-03-02 ENCOUNTER — Other Ambulatory Visit: Payer: BLUE CROSS/BLUE SHIELD

## 2022-03-02 DIAGNOSIS — Z130 Encounter for screening for diseases of the blood and blood-forming organs and certain disorders involving the immune mechanism: Secondary | ICD-10-CM

## 2022-03-02 DIAGNOSIS — Z0000 Encounter for general adult medical examination without abnormal findings: Secondary | ICD-10-CM

## 2022-03-03 LAB — COMPREHENSIVE METABOLIC PANEL
ALT: 34 IU/L (ref 0–44)
AST: 29 IU/L (ref 0–40)
Albumin/Globulin Ratio: 1.7 (ref 1.2–2.2)
Albumin: 4.5 g/dL (ref 4.0–5.0)
Alkaline Phosphatase: 130 IU/L — ABNORMAL HIGH (ref 44–121)
BUN/Creatinine Ratio: 11 (ref 9–20)
BUN: 9 mg/dL (ref 6–24)
Bilirubin Total: 0.4 mg/dL (ref 0.0–1.2)
CO2: 25 mmol/L (ref 20–29)
Calcium: 9.4 mg/dL (ref 8.7–10.2)
Chloride: 98 mmol/L (ref 96–106)
Creatinine, Ser: 0.82 mg/dL (ref 0.76–1.27)
Globulin, Total: 2.7 g/dL (ref 1.5–4.5)
Glucose: 103 mg/dL — ABNORMAL HIGH (ref 70–99)
Potassium: 4 mmol/L (ref 3.5–5.2)
Sodium: 140 mmol/L (ref 134–144)
Total Protein: 7.2 g/dL (ref 6.0–8.5)
eGFR: 111 mL/min/{1.73_m2} (ref >59)

## 2022-03-03 LAB — LIPID PANEL
Chol/HDL Ratio: 8.1 ratio — ABNORMAL HIGH (ref 0.0–5.0)
Cholesterol, Total: 235 mg/dL — ABNORMAL HIGH (ref 100–199)
HDL: 29 mg/dL — ABNORMAL LOW (ref >39)
LDL Chol Calc (NIH): 172 mg/dL — ABNORMAL HIGH (ref 0–99)
Triglycerides: 182 mg/dL — ABNORMAL HIGH (ref 0–149)
VLDL Cholesterol Cal: 34 mg/dL (ref 5–40)

## 2022-03-03 LAB — CBC
Hematocrit: 42.8 % (ref 37.5–51.0)
Hemoglobin: 14.4 g/dL (ref 13.0–17.7)
MCH: 30.1 pg (ref 26.6–33.0)
MCHC: 33.6 g/dL (ref 31.5–35.7)
MCV: 89 fL (ref 79–97)
Platelets: 205 10*3/uL (ref 150–450)
RBC: 4.79 x10E6/uL (ref 4.14–5.80)
RDW: 12.6 % (ref 11.6–15.4)
WBC: 3.7 10*3/uL (ref 3.4–10.8)

## 2022-03-03 LAB — HEMOGLOBIN A1C
Est. average glucose Bld gHb Est-mCnc: 120 mg/dL
Hgb A1c MFr Bld: 5.8 % — ABNORMAL HIGH (ref 4.8–5.6)

## 2022-03-03 LAB — TSH: TSH: 0.877 u[IU]/mL (ref 0.450–4.500)

## 2022-03-05 NOTE — Progress Notes (Signed)
High cholesterol. Discuss with patient at visit 03/09/2022

## 2022-03-09 ENCOUNTER — Encounter: Payer: Self-pay | Admitting: Nurse Practitioner

## 2022-03-09 ENCOUNTER — Ambulatory Visit (INDEPENDENT_AMBULATORY_CARE_PROVIDER_SITE_OTHER): Payer: Self-pay | Admitting: Nurse Practitioner

## 2022-03-09 VITALS — BP 139/76 | HR 72 | Temp 98.1°F | Ht 68.11 in | Wt 221.0 lb

## 2022-03-09 DIAGNOSIS — Q246 Congenital heart block: Secondary | ICD-10-CM

## 2022-03-09 DIAGNOSIS — E782 Mixed hyperlipidemia: Secondary | ICD-10-CM

## 2022-03-09 DIAGNOSIS — Z0001 Encounter for general adult medical examination with abnormal findings: Secondary | ICD-10-CM

## 2022-03-09 DIAGNOSIS — Z6833 Body mass index (BMI) 33.0-33.9, adult: Secondary | ICD-10-CM

## 2022-03-09 MED ORDER — ROSUVASTATIN CALCIUM 5 MG PO TABS: 5.0000 mg | ORAL_TABLET | Freq: Every day | ORAL | 1 refills | Status: AC

## 2022-03-09 NOTE — Progress Notes (Signed)
Established patient visit ? ? ?Patient: Jonathan Logan   DOB: 19-Jul-1976   46 y.o. Male  MRN: [id number] ?Visit Date: 03/09/2022 ? ? ?Chief Complaint  ?Patient presents with  ? Annual Exam  ? ?Subjective  ?  ?HPI  ?Patient reports for annual woman's visit. ?Recent labs show moderate, generalized elevation of lipid panel. ?Hemoglobin A1c 5.8. ?Blood pressure well managed without medications. ?denies chest pain, chest pressure, or shortness of breath. He denies headaches or visual disturbances. He denies abdominal pain, nausea, vomiting, or changes in bowel or bladder habits.   ? ? ?Medications: ?Outpatient Medications Prior to Visit  ?Medication Sig  ? ibuprofen (ADVIL,MOTRIN) 200 MG tablet Take 600 mg by mouth every 6 (six) hours as needed for moderate pain.  ? ?No facility-administered medications prior to visit.  ? ? ?Review of Systems  ?Constitutional:  Positive for fatigue. Negative for activity change, chills and fever.  ?HENT:  Negative for congestion, postnasal drip, rhinorrhea, sinus pressure, sinus pain, sneezing and sore throat.   ?Eyes: Negative.   ?Respiratory:  Negative for cough, shortness of breath and wheezing.   ?Cardiovascular:  Negative for chest pain and palpitations.  ?     Patient has history of third-degree AV block.  He states this is congenital disorder.  Has a permanent pacemaker in place.   ?Gastrointestinal:  Negative for constipation, diarrhea, nausea and vomiting.  ?Endocrine: Negative for cold intolerance, heat intolerance, polydipsia and polyuria.  ?Genitourinary:  Negative for dysuria, frequency and urgency.  ?Musculoskeletal:  Positive for arthralgias and myalgias. Negative for back pain.  ?Skin:  Negative for rash.  ?Allergic/Immunologic: Negative for environmental allergies.  ?Neurological:  Negative for dizziness, weakness and headaches.  ?Psychiatric/Behavioral:  The patient is not nervous/anxious.   ? ?Last CBC ?Lab Results  ?Component Value Date  ? WBC 3.7 03/02/2022  ? HGB  14.4 03/02/2022  ? HCT 42.8 03/02/2022  ? MCV 89 03/02/2022  ? MCH 30.1 03/02/2022  ? RDW 12.6 03/02/2022  ? PLT 205 03/02/2022  ? ?Last metabolic panel ?Lab Results  ?Component Value Date  ? GLUCOSE 103 (H) 03/02/2022  ? NA 140 03/02/2022  ? K 4.0 03/02/2022  ? CL 98 03/02/2022  ? CO2 25 03/02/2022  ? BUN 9 03/02/2022  ? CREATININE 0.82 03/02/2022  ? EGFR 111 03/02/2022  ? CALCIUM 9.4 03/02/2022  ? PROT 7.2 03/02/2022  ? ALBUMIN 4.5 03/02/2022  ? LABGLOB 2.7 03/02/2022  ? AGRATIO 1.7 03/02/2022  ? BILITOT 0.4 03/02/2022  ? ALKPHOS 130 (H) 03/02/2022  ? AST 29 03/02/2022  ? ALT 34 03/02/2022  ? ?Last lipids ?Lab Results  ?Component Value Date  ? CHOL 235 (H) 03/02/2022  ? HDL 29 (L) 03/02/2022  ? LDLCALC 172 (H) 03/02/2022  ? TRIG 182 (H) 03/02/2022  ? CHOLHDL 8.1 (H) 03/02/2022  ? ?Last hemoglobin A1c ?Lab Results  ?Component Value Date  ? HGBA1C 5.8 (H) 03/02/2022  ? ?Last thyroid functions ?Lab Results  ?Component Value Date  ? TSH 0.877 03/02/2022  ? ?  ? ? Objective  ?  ? ?Today's Vitals  ? 03/09/22 0942  ?BP: 139/76  ?Pulse: 72  ?Temp: 98.1 ?F (36.7 ?C)  ?SpO2: 97%  ?Weight: 221 lb (100.2 kg)  ?Height: 5' 8.11" (1.73 m)  ? ?Body mass index is 33.49 kg/m?.  ? ? ?BP Readings from Last 3 Encounters:  ?03/09/22 139/76  ?01/18/22 130/70  ?12/27/21 (!) 167/98  ?  ?Wt Readings from Last 3 Encounters:  ?03/09/22 221  lb (100.2 kg)  ?01/18/22 282 lb 12.8 oz (128.3 kg)  ?10/20/21 282 lb 3.2 oz (128 kg)  ?  ?Physical Exam ?Vitals and nursing note reviewed.  ?Constitutional:   ?   Appearance: Normal appearance. He is well-developed.  ?HENT:  ?   Head: Normocephalic and atraumatic.  ?   Right Ear: Tympanic membrane, ear canal and external ear normal.  ?   Left Ear: Tympanic membrane, ear canal and external ear normal.  ?   Nose: Nose normal.  ?   Mouth/Throat:  ?   Mouth: Mucous membranes are moist.  ?   Pharynx: Oropharynx is clear.  ?Eyes:  ?   Extraocular Movements: Extraocular movements intact.  ?   Conjunctiva/sclera:  Conjunctivae normal.  ?   Pupils: Pupils are equal, round, and reactive to light.  ?Cardiovascular:  ?   Rate and Rhythm: Normal rate and regular rhythm.  ?   Pulses: Normal pulses.  ?   Heart sounds: Normal heart sounds.  ?Pulmonary:  ?   Effort: Pulmonary effort is normal.  ?   Breath sounds: Normal breath sounds.  ?Abdominal:  ?   General: Bowel sounds are normal. There is no distension.  ?   Palpations: Abdomen is soft. There is no mass.  ?   Tenderness: There is no abdominal tenderness. There is no right CVA tenderness, left CVA tenderness, guarding or rebound.  ?   Hernia: No hernia is present.  ?Musculoskeletal:     ?   General: Normal range of motion.  ?   Cervical back: Normal range of motion and neck supple.  ?Lymphadenopathy:  ?   Cervical: No cervical adenopathy.  ?Skin: ?   General: Skin is warm and dry.  ?   Capillary Refill: Capillary refill takes less than 2 seconds.  ?Neurological:  ?   General: No focal deficit present.  ?   Mental Status: He is alert and oriented to person, place, and time.  ?Psychiatric:     ?   Mood and Affect: Mood normal.     ?   Behavior: Behavior normal.     ?   Thought Content: Thought content normal.     ?   Judgment: Judgment normal.  ?  ? ? Assessment & Plan  ?  ?1. Encounter for general adult medical examination with abnormal findings ?Annual wellness visit today. ? ?2. Mixed hyperlipidemia ?Reviewed recent labs showing moderate, generalized elevation of lipid panel.  Start Crestor 5 mg tablets daily.  Recommend he take with OTC vitamin co-Q10.  Will recheck fasting lipids along with CMP in 3 months.  Adjust Crestor dosing as indicated. Recommend patient limit intake of fried and fatty foods. He should increase intake of lean proteins and green leafy vegetables. Adding exercise into daily routine will also be beneficial.   ?- rosuvastatin (CRESTOR) 5 MG tablet; Take 1 tablet (5 mg total) by mouth daily with supper.  Dispense: 90 tablet; Refill: 1 ? ?3. Body mass index  (BMI) of 33.0-33.9 in adult ?Encourage patient to limit calorie intake to 2000 cal/day or less.  He should consume a low cholesterol, low-fat diet.  Recommend he incorporate exercise into his daily routine.  ? ?4. Congenital heart block ?Patient does have permanent pacemaker.  He should continue regular visits with cardiology. ? ?Problem List Items Addressed This Visit   ? ?  ? Cardiovascular and Mediastinum  ? Congenital heart block  ? Relevant Medications  ? rosuvastatin (CRESTOR) 5 MG tablet  ? ?  Other Visit Diagnoses   ? ? Encounter for general adult medical examination with abnormal findings    -  Primary  ? Mixed hyperlipidemia      ? Relevant Medications  ? rosuvastatin (CRESTOR) 5 MG tablet  ? Body mass index (BMI) of 33.0-33.9 in adult      ? ?  ?  ? ?Return in about 3 months (around 06/08/2022) for HLD. check fasting lipids, CMp, and HgbA1c prior to visit .  ?   ? ? ? ? ?Ronnell Freshwater, NP  ?[facility] ?[phone] (phone) ?[phone] (fax) ? ? Medical Group  ?

## 2022-03-21 NOTE — Patient Instructions (Signed)
Fat and Cholesterol Restricted Eating Plan Getting too much fat and cholesterol in your diet may cause health problems. Choosing the right foods helps keep your fat and cholesterol at normal levels. This can keep you from getting certain diseases. Your doctor may recommend an eating plan that includes: Total fat: ______% or less of total calories a day. This is ______g of fat a day. Saturated fat: ______% or less of total calories a day. This is ______g of saturated fat a day. Cholesterol: less than _________mg a day. Fiber: ______g a day. What are tips for following this plan? General tips Work with your doctor to lose weight if you need to. Avoid: Foods with added sugar. Fried foods. Foods with trans fat or partially hydrogenated oils. This includes some margarines and baked goods. If you drink alcohol: Limit how much you have to: 0-1 drink a day for women who are not pregnant. 0-2 drinks a day for men. Know how much alcohol is in a drink. In the [place] one drink equals one 12 oz bottle of beer (355 mL), one 5 oz glass of wine (148 mL), or one 1 oz glass of hard liquor (44 mL). Reading food labels Check food labels for: Trans fats. Partially hydrogenated oils. Saturated fat (g) in each serving. Cholesterol (mg) in each serving. Fiber (g) in each serving. Choose foods with healthy fats, such as: Monounsaturated fats and polyunsaturated fats. These include olive and canola oil, flaxseeds, walnuts, almonds, and seeds. Omega-3 fats. These are found in certain fish, flaxseed oil, and ground flaxseeds. Choose grain products that have whole grains. Look for the word "whole" as the first word in the ingredient list. Cooking Cook foods using low-fat methods. These include baking, boiling, grilling, and broiling. Eat more home-cooked foods. Eat at restaurants and buffets less often. Eat less fast food. Avoid cooking using saturated fats, such as butter, cream, palm oil, palm kernel oil, and  coconut oil. Meal planning  At meals, divide your plate into four equal parts: Fill one-half of your plate with vegetables, green salads, and fruit. Fill one-fourth of your plate with whole grains. Fill one-fourth of your plate with low-fat (lean) protein foods. Eat fish that is high in omega-3 fats at least two times a week. This includes mackerel, tuna, sardines, and salmon. Eat foods that are high in fiber, such as whole grains, beans, apples, pears, berries, broccoli, carrots, peas, and barley. What foods should I eat? Fruits All fresh, canned (in natural juice), or frozen fruits. Vegetables Fresh or frozen vegetables (raw, steamed, roasted, or grilled). Green salads. Grains Whole grains, such as whole wheat or whole grain breads, crackers, cereals, and pasta. Unsweetened oatmeal, bulgur, barley, quinoa, or brown rice. Corn or whole wheat flour tortillas. Meats and other protein foods Ground beef (85% or leaner), grass-fed beef, or beef trimmed of fat. Skinless chicken or turkey. Ground chicken or turkey. Pork trimmed of fat. All fish and seafood. Egg whites. Dried beans, peas, or lentils. Unsalted nuts or seeds. Unsalted canned beans. Nut butters without added sugar or oil. Dairy Low-fat or nonfat dairy products, such as skim or 1% milk, 2% or reduced-fat cheeses, low-fat and fat-free ricotta or cottage cheese, or plain low-fat and nonfat yogurt. Fats and oils Tub margarine without trans fats. Light or reduced-fat mayonnaise and salad dressings. Avocado. Olive, canola, sesame, or safflower oils. The items listed above may not be a complete list of foods and beverages you can eat. Contact a dietitian for more information. What foods   should I avoid? Fruits Canned fruit in heavy syrup. Fruit in cream or butter sauce. Fried fruit. Vegetables Vegetables cooked in cheese, cream, or butter sauce. Fried vegetables. Grains White bread. White pasta. White rice. Cornbread. Bagels, pastries,  and croissants. Crackers and snack foods that contain trans fat and hydrogenated oils. Meats and other protein foods Fatty cuts of meat. Ribs, chicken wings, bacon, sausage, bologna, salami, chitterlings, fatback, hot dogs, bratwurst, and packaged lunch meats. Liver and organ meats. Whole eggs and egg yolks. Chicken and turkey with skin. Fried meat. Dairy Whole or 2% milk, cream, half-and-half, and cream cheese. Whole milk cheeses. Whole-fat or sweetened yogurt. Full-fat cheeses. Nondairy creamers and whipped toppings. Processed cheese, cheese spreads, and cheese curds. Fats and oils Butter, stick margarine, lard, shortening, ghee, or bacon fat. Coconut, palm kernel, and palm oils. Beverages Alcohol. Sugar-sweetened drinks such as sodas, lemonade, and fruit drinks. Sweets and desserts Corn syrup, sugars, honey, and molasses. Candy. Jam and jelly. Syrup. Sweetened cereals. Cookies, pies, cakes, donuts, muffins, and ice cream. The items listed above may not be a complete list of foods and beverages you should avoid. Contact a dietitian for more information. Summary Choosing the right foods helps keep your fat and cholesterol at normal levels. This can keep you from getting certain diseases. At meals, fill one-half of your plate with vegetables, green salads, and fruits. Eat high fiber foods, like whole grains, beans, apples, pears, berries, carrots, peas, and barley. Limit added sugar, saturated fats, alcohol, and fried foods. This information is not intended to replace advice given to you by your health care provider. Make sure you discuss any questions you have with your health care provider. Document Revised: 04/07/2021 Document Reviewed: 04/07/2021 Elsevier Patient Education  2022 Elsevier Inc.  

## 2022-04-03 ENCOUNTER — Ambulatory Visit (INDEPENDENT_AMBULATORY_CARE_PROVIDER_SITE_OTHER): Payer: PRIVATE HEALTH INSURANCE

## 2022-04-03 DIAGNOSIS — I442 Atrioventricular block, complete: Secondary | ICD-10-CM

## 2022-04-04 LAB — CUP PACEART REMOTE DEVICE CHECK
Battery Impedance: 1123 Ohm
Battery Remaining Longevity: 53 mo
Battery Voltage: 2.77 V
Brady Statistic AP VP Percent: 7 %
Brady Statistic AP VS Percent: 0 %
Brady Statistic AS VP Percent: 93 %
Brady Statistic AS VS Percent: 0 %
Date Time Interrogation Session: 20230425104216
Implantable Lead Implant Date: 20141110
Implantable Lead Implant Date: 20141110
Implantable Lead Location: 753859
Implantable Lead Location: 753860
Implantable Lead Model: 5076
Implantable Lead Model: 5076
Implantable Pulse Generator Implant Date: 20141110
Lead Channel Impedance Value: 392 Ohm
Lead Channel Impedance Value: 559 Ohm
Lead Channel Pacing Threshold Amplitude: 0.625 V
Lead Channel Pacing Threshold Amplitude: 0.75 V
Lead Channel Pacing Threshold Pulse Width: 0.4 ms
Lead Channel Pacing Threshold Pulse Width: 0.4 ms
Lead Channel Setting Pacing Amplitude: 2 V
Lead Channel Setting Pacing Amplitude: 2.5 V
Lead Channel Setting Pacing Pulse Width: 0.4 ms
Lead Channel Setting Sensing Sensitivity: 2.8 mV

## 2022-04-19 NOTE — Progress Notes (Signed)
Remote pacemaker transmission.   

## 2022-07-09 ENCOUNTER — Ambulatory Visit (INDEPENDENT_AMBULATORY_CARE_PROVIDER_SITE_OTHER): Payer: Self-pay

## 2022-07-09 DIAGNOSIS — I442 Atrioventricular block, complete: Secondary | ICD-10-CM

## 2022-07-10 LAB — CUP PACEART REMOTE DEVICE CHECK
Battery Impedance: 1203 Ohm
Battery Remaining Longevity: 51 mo
Battery Voltage: 2.77 V
Brady Statistic AP VP Percent: 8 %
Brady Statistic AP VS Percent: 0 %
Brady Statistic AS VP Percent: 92 %
Brady Statistic AS VS Percent: 0 %
Date Time Interrogation Session: 20230731113257
Implantable Lead Implant Date: 20141110
Implantable Lead Implant Date: 20141110
Implantable Lead Location: 753859
Implantable Lead Location: 753860
Implantable Lead Model: 5076
Implantable Lead Model: 5076
Implantable Pulse Generator Implant Date: 20141110
Lead Channel Impedance Value: 403 Ohm
Lead Channel Impedance Value: 584 Ohm
Lead Channel Pacing Threshold Amplitude: 0.5 V
Lead Channel Pacing Threshold Amplitude: 0.625 V
Lead Channel Pacing Threshold Pulse Width: 0.4 ms
Lead Channel Pacing Threshold Pulse Width: 0.4 ms
Lead Channel Setting Pacing Amplitude: 2 V
Lead Channel Setting Pacing Amplitude: 2.5 V
Lead Channel Setting Pacing Pulse Width: 0.4 ms
Lead Channel Setting Sensing Sensitivity: 2.8 mV

## 2022-08-11 NOTE — Progress Notes (Signed)
Remote pacemaker transmission.   

## 2022-10-08 ENCOUNTER — Ambulatory Visit (INDEPENDENT_AMBULATORY_CARE_PROVIDER_SITE_OTHER): Payer: Self-pay

## 2022-10-08 DIAGNOSIS — I442 Atrioventricular block, complete: Secondary | ICD-10-CM

## 2022-10-10 LAB — CUP PACEART REMOTE DEVICE CHECK
Battery Impedance: 1423 Ohm
Battery Remaining Longevity: 45 mo
Battery Voltage: 2.77 V
Brady Statistic AP VP Percent: 9 %
Brady Statistic AP VS Percent: 0 %
Brady Statistic AS VP Percent: 91 %
Brady Statistic AS VS Percent: 0 %
Date Time Interrogation Session: 20231031131938
Implantable Lead Connection Status: 753985
Implantable Lead Connection Status: 753985
Implantable Lead Implant Date: 20141110
Implantable Lead Implant Date: 20141110
Implantable Lead Location: 753859
Implantable Lead Location: 753860
Implantable Lead Model: 5076
Implantable Lead Model: 5076
Implantable Pulse Generator Implant Date: 20141110
Lead Channel Impedance Value: 403 Ohm
Lead Channel Impedance Value: 576 Ohm
Lead Channel Pacing Threshold Amplitude: 0.625 V
Lead Channel Pacing Threshold Amplitude: 0.75 V
Lead Channel Pacing Threshold Pulse Width: 0.4 ms
Lead Channel Pacing Threshold Pulse Width: 0.4 ms
Lead Channel Setting Pacing Amplitude: 2 V
Lead Channel Setting Pacing Amplitude: 2.5 V
Lead Channel Setting Pacing Pulse Width: 0.4 ms
Lead Channel Setting Sensing Sensitivity: 2.8 mV
Zone Setting Status: 755011
Zone Setting Status: 755011

## 2022-10-16 ENCOUNTER — Ambulatory Visit: Payer: Self-pay

## 2022-10-21 ENCOUNTER — Ambulatory Visit
Admission: EM | Admit: 2022-10-21 | Discharge: 2022-10-21 | Disposition: A | Discharge status: Home / Self Care | Payer: Self-pay | Attending: Urgent Care | Admitting: Urgent Care

## 2022-10-21 ENCOUNTER — Encounter: Payer: Self-pay | Admitting: Emergency Medicine

## 2022-10-21 DIAGNOSIS — H65192 Other acute nonsuppurative otitis media, left ear: Secondary | ICD-10-CM

## 2022-10-21 DIAGNOSIS — H6122 Impacted cerumen, left ear: Secondary | ICD-10-CM

## 2022-10-21 DIAGNOSIS — T162XXA Foreign body in left ear, initial encounter: Secondary | ICD-10-CM

## 2022-10-21 HISTORY — DX: Pure hypercholesterolemia, unspecified: E78.00

## 2022-10-21 MED ORDER — AMOXICILLIN-POT CLAVULANATE 875-125 MG PO TABS
1.0000 | ORAL_TABLET | Freq: Two times a day (BID) | ORAL | 0 refills | Status: AC
Start: 2022-10-21 — End: ?

## 2022-10-21 MED ORDER — CARBAMIDE PEROXIDE 6.5 % OT SOLN: 5.0000 [drp] | Freq: Two times a day (BID) | OTIC | 0 refills | Status: AC

## 2022-10-21 MED ORDER — CETIRIZINE HCL 10 MG PO TABS: 10.0000 mg | ORAL_TABLET | Freq: Every day | ORAL | 0 refills | Status: AC

## 2022-10-21 MED ORDER — PSEUDOEPHEDRINE HCL 30 MG PO TABS: 30.0000 mg | ORAL_TABLET | Freq: Three times a day (TID) | ORAL | 0 refills | Status: AC | PRN

## 2022-10-21 NOTE — ED Provider Notes (Signed)
[facility]  Note:  This document was prepared using Conservation officer, historic buildings and may include unintentional dictation errors.  MRN: [id number] DOB: 22-Sep-1976  Subjective:   Jonathan Logan is a 46 y.o. male presenting for 2-day history of acute onset persistent left ear discomfort, left ear fullness.  No fever, drainage, dizziness, tinnitus.  Patient does use Q-tips inside of his ear consistently.  Can perceive if he has a foreign body sensation.  No current facility-administered medications for this encounter.  Current Outpatient Medications:    ibuprofen (ADVIL,MOTRIN) 200 MG tablet, Take 600 mg by mouth every 6 (six) hours as needed for moderate pain., Disp: , Rfl:    rosuvastatin (CRESTOR) 5 MG tablet, Take 1 tablet (5 mg total) by mouth daily with supper., Disp: 90 tablet, Rfl: 1   No Known Allergies  Past Medical History:  Diagnosis Date   Complete heart block (HCC)    Congenital heart block    H/O congenital heart block    High cholesterol    Mobitz type 2 second degree AV block    Syncope      Past Surgical History:  Procedure Laterality Date   PERMANENT PACEMAKER INSERTION N/A 10/19/2013   Procedure: PERMANENT PACEMAKER INSERTION;  Surgeon: Marinus Maw, MD;  Location: [facility] CATH LAB;  Service: Cardiovascular;  Laterality: N/A;    Family History  Problem Relation Age of Onset   Diabetes Mother    Hypertension Mother    Healthy Sister    Healthy Brother    Healthy Brother    Healthy Brother    Healthy Daughter    Healthy Son    Healthy Daughter    Premature birth Daughter    Healthy Son    Healthy Son    Healthy Son     Social History   Tobacco Use   Smoking status: Former    Packs/day: 0.05    Types: Cigarettes    Quit date: 12/10/2013    Years since quitting: 8.8   Smokeless tobacco: Never   Tobacco comments:    VERY LIGHT, OCCASIONAL SMOKER  Vaping Use   Vaping Use: Never used  Substance Use Topics    Alcohol use: Never    Comment: occassionally   Drug use: No    ROS   Objective:   Vitals: BP 135/83 (BP Location: Left Arm)   Pulse 73   Temp 98.6 F (37 C) (Oral)   Resp 16   SpO2 95%   Physical Exam Constitutional:      General: He is not in acute distress.    Appearance: Normal appearance. He is well-developed and normal weight. He is not ill-appearing, toxic-appearing or diaphoretic.  HENT:     Head: Normocephalic and atraumatic.     Right Ear: Tympanic membrane, ear canal and external ear normal. There is no impacted cerumen.     Left Ear: Ear canal normal. There is impacted cerumen.     Ears:     Comments: TM found to be erythematous and bulging with effusion following the ear lavage.    Nose: Nose normal.     Mouth/Throat:     Pharynx: Oropharynx is clear.  Eyes:     General: No scleral icterus.       Right eye: No discharge.        Left eye: No discharge.     Extraocular Movements: Extraocular movements intact.  Cardiovascular:     Rate and Rhythm: Normal rate.  Pulmonary:     Effort: Pulmonary effort is normal.  Musculoskeletal:     Cervical back: Normal range of motion.  Neurological:     Mental Status: He is alert and oriented to person, place, and time.  Psychiatric:        Mood and Affect: Mood normal.        Behavior: Behavior normal.        Thought Content: Thought content normal.        Judgment: Judgment normal.     Ear lavage performed using mixture of peroxide and water.  Pressure irrigation performed using a bottle and a thin ear tube.  Left ear lavage.  Curette was used.  Foreign body of the end of a Q-tip was removed.  Alligator forceps were also used to attempt this.   Assessment and Plan :   PDMP not reviewed this encounter.  1. Other acute nonsuppurative otitis media of left ear, recurrence not specified   2. Foreign body of left ear, initial encounter   3. Impacted cerumen of left ear     Successful foreign body removal,  successful ear lavage.  Recommended avoiding Q-tips going forward.  Start Augmentin to address a secondary otitis media.  Use Zyrtec and pseudoephedrine for supportive care.  Use Debrox going forward for cerumen impaction.  Follow-up with ENT especially if symptoms persist. Counseled patient on potential for adverse effects with medications prescribed/recommended today, ER and return-to-clinic precautions discussed, patient verbalized understanding.    Wallis Bamberg, PA-C 10/21/22 1224

## 2022-10-21 NOTE — ED Triage Notes (Signed)
Pt c/o left ear pain for a couple days. Worse at night. Muffle and trouble hearing.

## 2022-11-10 NOTE — Progress Notes (Signed)
Remote pacemaker transmission.   

## 2023-01-07 ENCOUNTER — Ambulatory Visit: Payer: Self-pay

## 2023-01-07 DIAGNOSIS — I442 Atrioventricular block, complete: Secondary | ICD-10-CM

## 2023-01-08 LAB — CUP PACEART REMOTE DEVICE CHECK
Battery Impedance: 1396 Ohm
Battery Remaining Longevity: 45 mo
Battery Voltage: 2.77 V
Brady Statistic AP VP Percent: 9 %
Brady Statistic AP VS Percent: 0 %
Brady Statistic AS VP Percent: 91 %
Brady Statistic AS VS Percent: 0 %
Date Time Interrogation Session: 20240130144202
Implantable Lead Connection Status: 753985
Implantable Lead Connection Status: 753985
Implantable Lead Implant Date: 20141110
Implantable Lead Implant Date: 20141110
Implantable Lead Location: 753859
Implantable Lead Location: 753860
Implantable Lead Model: 5076
Implantable Lead Model: 5076
Implantable Pulse Generator Implant Date: 20141110
Lead Channel Impedance Value: 388 Ohm
Lead Channel Impedance Value: 563 Ohm
Lead Channel Pacing Threshold Amplitude: 0.5 V
Lead Channel Pacing Threshold Amplitude: 0.625 V
Lead Channel Pacing Threshold Pulse Width: 0.4 ms
Lead Channel Pacing Threshold Pulse Width: 0.4 ms
Lead Channel Setting Pacing Amplitude: 2 V
Lead Channel Setting Pacing Amplitude: 2.5 V
Lead Channel Setting Pacing Pulse Width: 0.4 ms
Lead Channel Setting Sensing Sensitivity: 2.8 mV
Zone Setting Status: 755011
Zone Setting Status: 755011

## 2023-02-20 NOTE — Progress Notes (Signed)
Remote pacemaker transmission.   

## 2023-04-08 ENCOUNTER — Ambulatory Visit: Payer: PRIVATE HEALTH INSURANCE

## 2023-04-24 ENCOUNTER — Ambulatory Visit (INDEPENDENT_AMBULATORY_CARE_PROVIDER_SITE_OTHER): Payer: PRIVATE HEALTH INSURANCE

## 2023-04-24 DIAGNOSIS — I442 Atrioventricular block, complete: Secondary | ICD-10-CM

## 2023-04-25 LAB — CUP PACEART REMOTE DEVICE CHECK
Battery Impedance: 1563 Ohm
Battery Remaining Longevity: 42 mo
Battery Voltage: 2.76 V
Brady Statistic AP VP Percent: 10 %
Brady Statistic AP VS Percent: 0 %
Brady Statistic AS VP Percent: 90 %
Brady Statistic AS VS Percent: 0 %
Date Time Interrogation Session: 20240515145237
Implantable Lead Connection Status: 753985
Implantable Lead Connection Status: 753985
Implantable Lead Implant Date: 20141110
Implantable Lead Implant Date: 20141110
Implantable Lead Location: 753859
Implantable Lead Location: 753860
Implantable Lead Model: 5076
Implantable Lead Model: 5076
Implantable Pulse Generator Implant Date: 20141110
Lead Channel Impedance Value: 403 Ohm
Lead Channel Impedance Value: 587 Ohm
Lead Channel Pacing Threshold Amplitude: 0.625 V
Lead Channel Pacing Threshold Amplitude: 0.625 V
Lead Channel Pacing Threshold Pulse Width: 0.4 ms
Lead Channel Pacing Threshold Pulse Width: 0.4 ms
Lead Channel Setting Pacing Amplitude: 2 V
Lead Channel Setting Pacing Amplitude: 2.5 V
Lead Channel Setting Pacing Pulse Width: 0.4 ms
Lead Channel Setting Sensing Sensitivity: 2.8 mV
Zone Setting Status: 755011
Zone Setting Status: 755011

## 2023-05-09 NOTE — Progress Notes (Signed)
Remote pacemaker transmission.   

## 2023-06-22 ENCOUNTER — Emergency Department (HOSPITAL_BASED_OUTPATIENT_CLINIC_OR_DEPARTMENT_OTHER)
Admission: EM | Admit: 2023-06-22 | Discharge: 2023-06-22 | Disposition: A | Discharge status: Home / Self Care | Payer: Self-pay | Attending: Emergency Medicine | Admitting: Emergency Medicine

## 2023-06-22 ENCOUNTER — Encounter (HOSPITAL_BASED_OUTPATIENT_CLINIC_OR_DEPARTMENT_OTHER): Payer: Self-pay

## 2023-06-22 ENCOUNTER — Other Ambulatory Visit: Payer: Self-pay

## 2023-06-22 DIAGNOSIS — M5442 Lumbago with sciatica, left side: Secondary | ICD-10-CM | POA: Insufficient documentation

## 2023-06-22 MED ORDER — PREDNISONE 20 MG PO TABS: 40.0000 mg | ORAL_TABLET | Freq: Every day | ORAL | 0 refills | Status: AC

## 2023-06-22 MED ORDER — CYCLOBENZAPRINE HCL 10 MG PO TABS: 10.0000 mg | ORAL_TABLET | Freq: Three times a day (TID) | ORAL | 0 refills | Status: AC

## 2023-06-22 MED ORDER — DIAZEPAM 5 MG PO TABS
5.0000 mg | ORAL_TABLET | Freq: Once | ORAL | Status: AC
Start: 2023-06-22 — End: 2023-06-22
  Administered 2023-06-22: 5 mg via ORAL
  Filled 2023-06-22: qty 1

## 2023-06-22 MED ORDER — ACETAMINOPHEN 500 MG PO TABS
1000.0000 mg | ORAL_TABLET | Freq: Once | ORAL | Status: AC
Start: 2023-06-22 — End: 2023-06-22
  Administered 2023-06-22: 1000 mg via ORAL
  Filled 2023-06-22: qty 2

## 2023-06-22 MED ORDER — OXYCODONE HCL 5 MG PO TABS
5.0000 mg | ORAL_TABLET | Freq: Once | ORAL | Status: AC
  Administered 2023-06-22: 5 mg via ORAL
  Filled 2023-06-22: qty 1

## 2023-06-22 MED ORDER — KETOROLAC TROMETHAMINE 15 MG/ML IJ SOLN
15.0000 mg | Freq: Once | INTRAMUSCULAR | Status: AC
  Administered 2023-06-22: 15 mg via INTRAMUSCULAR
  Filled 2023-06-22: qty 1

## 2023-06-22 MED ORDER — LIDOCAINE 5 % EX PTCH
1.0000 | MEDICATED_PATCH | CUTANEOUS | Status: DC
  Administered 2023-06-22: 1 via TRANSDERMAL
  Filled 2023-06-22: qty 1

## 2023-06-22 NOTE — Discharge Instructions (Addendum)
I have prescribed muscle relaxers for your pain, please do not drink or drive while taking this medications as it can make you drowsy.   I have also prescribed steroids, be aware this medication can cause insomnia, appetite changes.    Please follow-up with PCP in 1 week for reevaluation of your symptoms.If you experience any bowel or bladder incontinence, fever, worsening in your symptoms please return to the ED.  

## 2023-06-22 NOTE — ED Triage Notes (Signed)
Patient having left side back pain, hip, knee thigh and knee pain. He had a injury a year ago. No new injury.

## 2023-06-22 NOTE — ED Provider Notes (Signed)
[facility] Provider Note   CSN: [id number] Arrival date & time: 06/22/23  1348     History  Chief Complaint  Patient presents with   Back Pain    Jonathan Logan is a 47 y.o. male.  47 year old male with a past medical history of back pain presents to the ED with a chief complaint of sudden onset of left lower back pain which began approximately a year ago, however exacerbated over the last couple of days.  He reports previously having imaging done, did not show any acute findings.  Now describes a sharp sensation to the left buttocks radiating down his leg and wrapping around the left knee.  Exacerbated with ambulation, sitting down, lying on his left side.  He has tried over-the-counter medication without any improvement in symptoms.  No alleviating factors.  Denies any trauma, fever, IVDU, numbness.   The history is provided by the patient and medical records.  Back Pain Associated symptoms: no fever        Home Medications Prior to Admission medications   Medication Sig Start Date End Date Taking? Authorizing Provider  cyclobenzaprine (FLEXERIL) 10 MG tablet Take 1 tablet (10 mg total) by mouth 3 (three) times daily for 7 days. 06/22/23 06/29/23 Yes Emett Stapel, Leonie Douglas, PA-C  predniSONE (DELTASONE) 20 MG tablet Take 2 tablets (40 mg total) by mouth daily for 5 days. 06/22/23 06/27/23 Yes Addaline Peplinski, Leonie Douglas, PA-C  amoxicillin-clavulanate (AUGMENTIN) 875-125 MG tablet Take 1 tablet by mouth 2 (two) times daily. 10/21/22   Wallis Bamberg, PA-C  carbamide peroxide (DEBROX) 6.5 % OTIC solution Place 5 drops into both ears 2 (two) times daily. 10/21/22   Wallis Bamberg, PA-C  cetirizine (ZYRTEC ALLERGY) 10 MG tablet Take 1 tablet (10 mg total) by mouth daily. 10/21/22   Wallis Bamberg, PA-C  ibuprofen (ADVIL,MOTRIN) 200 MG tablet Take 600 mg by mouth every 6 (six) hours as needed for moderate pain.    [provider]  pseudoephedrine (SUDAFED) 30 MG  tablet Take 1 tablet (30 mg total) by mouth every 8 (eight) hours as needed for congestion. 10/21/22   Wallis Bamberg, PA-C  rosuvastatin (CRESTOR) 5 MG tablet Take 1 tablet (5 mg total) by mouth daily with supper. 03/09/22   Carlean Jews, NP      Allergies    Patient has no known allergies.    Review of Systems   Review of Systems  Constitutional:  Negative for fever.  Musculoskeletal:  Positive for back pain and myalgias.    Physical Exam Updated Vital Signs BP (!) 132/94 (BP Location: Right Arm)   Pulse 91   Temp 98.4 F (36.9 C) (Oral)   Resp (!) 24   Ht 5\' 8"  (1.727 m)   Wt 100 kg   SpO2 97%   BMI 33.52 kg/m  Physical Exam Vitals and nursing note reviewed.  Constitutional:      Appearance: Normal appearance.  HENT:     Head: Normocephalic and atraumatic.     Mouth/Throat:     Mouth: Mucous membranes are moist.  Cardiovascular:     Rate and Rhythm: Normal rate.  Pulmonary:     Effort: Pulmonary effort is normal.  Abdominal:     General: Abdomen is flat.  Musculoskeletal:     Cervical back: Normal range of motion and neck supple.     Comments: RLE- KF,KE 5/5 strength LLE- HF, HE 5/5 strength Normal gait. No pronator drift. No leg drop.  CN I, II and VIII not tested. CN II-XII grossly intact bilaterally.      Skin:    General: Skin is warm and dry.  Neurological:     Mental Status: He is alert and oriented to person, place, and time.    ED Results / Procedures / Treatments   Labs (all labs ordered are listed, but only abnormal results are displayed) Labs Reviewed - No data to display  EKG None  Radiology No results found.  Procedures Procedures    Medications Ordered in ED Medications  lidocaine (LIDODERM) 5 % 1 patch (1 patch Transdermal Patch Applied 06/22/23 1443)  acetaminophen (TYLENOL) tablet 1,000 mg (1,000 mg Oral Given 06/22/23 1443)  diazepam (VALIUM) tablet 5 mg (5 mg Oral Given 06/22/23 1443)  oxyCODONE (Oxy IR/ROXICODONE)  immediate release tablet 5 mg (5 mg Oral Given 06/22/23 1443)  ketorolac (TORADOL) 15 MG/ML injection 15 mg (15 mg Intramuscular Given 06/22/23 1443)    ED Course/ Medical Decision Making/ A&P                             Medical Decision Making Risk OTC drugs. Prescription drug management.   Patient presents to the ED with a chief complaint of left lower back pain which began approximately a year ago, symptoms exacerbated over the last couple of days.  No trauma, no new injury.  Denying any red flags such as fever, IV drug use, cancer, bowel or bladder complaints.  Pain is described as sharp radiating down the left buttocks down his leg and wrapping around to the left knee.   3:29 PM patient reports improvement in symptoms after receiving medication at this time.  He is standing with antalgic gait.  We discussed likely sciatica source, will go home on a short course of steroids along with muscle relaxers.  He is agreeable to plan and treatment, patient is hemodynamically stable for discharge.   Portions of this note were generated with Scientist, clinical (histocompatibility and immunogenetics). Dictation errors may occur despite best attempts at proofreading.   Final Clinical Impression(s) / ED Diagnoses Final diagnoses:  Acute left-sided low back pain with left-sided sciatica    Rx / DC Orders ED Discharge Orders          Ordered    predniSONE (DELTASONE) 20 MG tablet  Daily        06/22/23 1514    cyclobenzaprine (FLEXERIL) 10 MG tablet  3 times daily        06/22/23 1514              Claude Manges, PA-C 06/22/23 1531    Ernie Avena, MD 06/22/23 1536

## 2023-07-08 ENCOUNTER — Ambulatory Visit: Payer: PRIVATE HEALTH INSURANCE

## 2023-07-24 ENCOUNTER — Ambulatory Visit: Payer: PRIVATE HEALTH INSURANCE

## 2023-07-30 ENCOUNTER — Ambulatory Visit: Payer: PRIVATE HEALTH INSURANCE

## 2023-07-30 DIAGNOSIS — I442 Atrioventricular block, complete: Secondary | ICD-10-CM

## 2023-07-30 LAB — CUP PACEART REMOTE DEVICE CHECK
Battery Impedance: 1682 Ohm
Battery Remaining Longevity: 39 mo
Battery Voltage: 2.75 V
Brady Statistic AP VP Percent: 10 %
Brady Statistic AP VS Percent: 0 %
Brady Statistic AS VP Percent: 90 %
Brady Statistic AS VS Percent: 0 %
Date Time Interrogation Session: 20240820102030
Implantable Lead Connection Status: 753985
Implantable Lead Connection Status: 753985
Implantable Lead Implant Date: 20141110
Implantable Lead Implant Date: 20141110
Implantable Lead Location: 753859
Implantable Lead Location: 753860
Implantable Lead Model: 5076
Implantable Lead Model: 5076
Implantable Pulse Generator Implant Date: 20141110
Lead Channel Impedance Value: 404 Ohm
Lead Channel Impedance Value: 567 Ohm
Lead Channel Pacing Threshold Amplitude: 0.625 V
Lead Channel Pacing Threshold Amplitude: 0.625 V
Lead Channel Pacing Threshold Pulse Width: 0.4 ms
Lead Channel Pacing Threshold Pulse Width: 0.4 ms
Lead Channel Setting Pacing Amplitude: 2 V
Lead Channel Setting Pacing Amplitude: 2.5 V
Lead Channel Setting Pacing Pulse Width: 0.4 ms
Lead Channel Setting Sensing Sensitivity: 2.8 mV
Zone Setting Status: 755011
Zone Setting Status: 755011

## 2023-08-09 NOTE — Progress Notes (Signed)
Remote pacemaker transmission.   

## 2023-10-18 ENCOUNTER — Other Ambulatory Visit: Payer: Self-pay

## 2023-10-18 ENCOUNTER — Emergency Department (HOSPITAL_COMMUNITY): Payer: Self-pay

## 2023-10-18 ENCOUNTER — Emergency Department (HOSPITAL_COMMUNITY)
Admission: EM | Admit: 2023-10-18 | Discharge: 2023-10-18 | Disposition: A | Discharge status: Home / Self Care | Payer: Self-pay | Attending: Emergency Medicine | Admitting: Emergency Medicine

## 2023-10-18 DIAGNOSIS — M533 Sacrococcygeal disorders, not elsewhere classified: Secondary | ICD-10-CM | POA: Insufficient documentation

## 2023-10-18 DIAGNOSIS — S93431A Sprain of tibiofibular ligament of right ankle, initial encounter: Secondary | ICD-10-CM | POA: Insufficient documentation

## 2023-10-18 DIAGNOSIS — W19XXXA Unspecified fall, initial encounter: Secondary | ICD-10-CM | POA: Insufficient documentation

## 2023-10-18 DIAGNOSIS — M25571 Pain in right ankle and joints of right foot: Secondary | ICD-10-CM | POA: Insufficient documentation

## 2023-10-18 DIAGNOSIS — S93491A Sprain of other ligament of right ankle, initial encounter: Secondary | ICD-10-CM

## 2023-10-18 MED ORDER — ACETAMINOPHEN 325 MG PO TABS
650.0000 mg | ORAL_TABLET | Freq: Once | ORAL | Status: AC
  Administered 2023-10-18: 650 mg via ORAL
  Filled 2023-10-18: qty 2

## 2023-10-18 NOTE — ED Triage Notes (Signed)
Pt arrived via POV. C/o R foot pain that began after stretching in bed. Limited ROM. Also c/o tailbone pain that goes down R thigh for several month.

## 2023-10-18 NOTE — Progress Notes (Signed)
Orthopedic Tech Progress Note Patient Details:  Jonathan Logan [birth year] [id number]  Ortho Devices Type of Ortho Device: Ace wrap, CAM walker Ortho Device/Splint Location: Right ankle Ortho Device/Splint Interventions: Application   Post Interventions Patient Tolerated: Well  Jonathan Logan Jonathan Logan 10/18/2023, 8:23 PM

## 2023-10-18 NOTE — ED Provider Notes (Signed)
[facility] Provider Note   CSN: [id number] Arrival date & time: 10/18/23  1604     History  Chief Complaint  Patient presents with   Tailbone Pain   Foot Injury    Jonathan Logan is a 47 y.o. male with medical history of heart block, back pain.  Patient presents to ED for evaluation of mechanical fall and resulting right ankle pain.  Patient states that he was building a shed today when he stepped backwards and "stepped wrong".  Patient reports that when he placed his foot back he might of put it into a hole, he is unsure, but he fell backwards.  He is here complaining of right-sided ankle pain.  He denies hitting his head or losing consciousness.  States that he took ibuprofen prior to arrival but it did not "fully" take away his pain.  He reports it did reduce his pain.  Denies history of injury to the right ankle.  Denies back pain however x-ray of his sacrum was ordered in triage.  He states that he has had pain in the sacrum before but denies any pain today.  Reports that he was just answering "about places I have had pain before".  Denies any other concerns.   Foot Injury Associated symptoms: no back pain        Home Medications Prior to Admission medications   Medication Sig Start Date End Date Taking? Authorizing Provider  amoxicillin-clavulanate (AUGMENTIN) 875-125 MG tablet Take 1 tablet by mouth 2 (two) times daily. 10/21/22   Wallis Bamberg, PA-C  carbamide peroxide (DEBROX) 6.5 % OTIC solution Place 5 drops into both ears 2 (two) times daily. 10/21/22   Wallis Bamberg, PA-C  cetirizine (ZYRTEC ALLERGY) 10 MG tablet Take 1 tablet (10 mg total) by mouth daily. 10/21/22   Wallis Bamberg, PA-C  ibuprofen (ADVIL,MOTRIN) 200 MG tablet Take 600 mg by mouth every 6 (six) hours as needed for moderate pain.    [provider]  pseudoephedrine (SUDAFED) 30 MG tablet Take 1 tablet (30 mg total) by mouth every 8 (eight) hours as  needed for congestion. 10/21/22   Wallis Bamberg, PA-C  rosuvastatin (CRESTOR) 5 MG tablet Take 1 tablet (5 mg total) by mouth daily with supper. 03/09/22   Carlean Jews, NP      Allergies    Patient has no known allergies.    Review of Systems   Review of Systems  Musculoskeletal:  Positive for arthralgias. Negative for back pain.  All other systems reviewed and are negative.   Physical Exam Updated Vital Signs BP (!) 149/112 (BP Location: Right Arm)   Pulse 92   Temp 98.2 F (36.8 C) (Oral)   Resp 18   Ht 5\' 8"  (1.727 m)   Wt 97.5 kg   SpO2 95%   BMI 32.69 kg/m  Physical Exam Vitals and nursing note reviewed.  Constitutional:      General: He is not in acute distress.    Appearance: Normal appearance. He is not ill-appearing, toxic-appearing or diaphoretic.  HENT:     Head: Normocephalic and atraumatic.     Nose: Nose normal.     Mouth/Throat:     Mouth: Mucous membranes are moist.     Pharynx: Oropharynx is clear.  Eyes:     Extraocular Movements: Extraocular movements intact.     Conjunctiva/sclera: Conjunctivae normal.     Pupils: Pupils are equal, round, and reactive to light.  Cardiovascular:  Rate and Rhythm: Normal rate and regular rhythm.  Pulmonary:     Effort: Pulmonary effort is normal.     Breath sounds: Normal breath sounds. No wheezing.  Abdominal:     General: Abdomen is flat. Bowel sounds are normal.     Palpations: Abdomen is soft.     Tenderness: There is no abdominal tenderness.  Musculoskeletal:     Cervical back: Normal range of motion and neck supple. No tenderness.     Right ankle: No swelling, deformity or ecchymosis. Tenderness present. Decreased range of motion.     Comments: Tenderness to lateral portion of ankle.  Decreased range of motion secondary to pain.  No deformity.  No obvious swelling.  No overlying skin change.  Skin:    General: Skin is warm and dry.     Capillary Refill: Capillary refill takes less than 2 seconds.   Neurological:     Mental Status: He is alert and oriented to person, place, and time.     ED Results / Procedures / Treatments   Labs (all labs ordered are listed, but only abnormal results are displayed) Labs Reviewed - No data to display  EKG None  Radiology DG Foot Complete Right  Result Date: 10/18/2023 CLINICAL DATA:  Right foot pain after stretching in bed EXAM: RIGHT FOOT COMPLETE - 3+ VIEW COMPARISON:  None Available. FINDINGS: There is no evidence of fracture or dislocation. There is no evidence of arthropathy or other focal bone abnormality. Soft tissues are unremarkable. IMPRESSION: Negative. Electronically Signed   By: Minerva Fester M.D.   On: 10/18/2023 20:00   DG Sacrum/Coccyx  Result Date: 10/18/2023 CLINICAL DATA:  Tailbone pain going into the right thigh EXAM: SACRUM AND COCCYX - 2+ VIEW COMPARISON:  None Available. FINDINGS: There is no evidence of fracture or other focal bone lesions. IMPRESSION: Negative. Electronically Signed   By: Minerva Fester M.D.   On: 10/18/2023 19:59    Procedures Procedures   Medications Ordered in ED Medications  acetaminophen (TYLENOL) tablet 650 mg (650 mg Oral Given 10/18/23 1841)    ED Course/ Medical Decision Making/ A&P  Medical Decision Making Amount and/or Complexity of Data Reviewed Radiology: ordered.  Risk OTC drugs.   47 year old presents to ED for evaluation for rolling his ankle.  Please see HPI for further details.  On examination the patient right ankle has tenderness to the lateral portion.  There is decreased range of motion secondary to pain.  No deformity.  No obvious swelling, overlying skin change.  Extremity negative.  Will place patient in cam boot.  He already has crutches.  Will have him follow-up with orthopedics.  Advised to take ibuprofen at home for pain.  Discharged in stable condition.   Final Clinical Impression(s) / ED Diagnoses Final diagnoses:  Sprain of anterior talofibular  ligament of right ankle, initial encounter    Rx / DC Orders ED Discharge Orders     None         Clent Ridges 10/18/23 2256    Linwood Dibbles, MD 10/19/23 [phone]

## 2023-10-18 NOTE — Discharge Instructions (Signed)
Your xray imaging was negative today.  Please remain in the cam boot and use crutches as needed.  Please follow-up with Dr. Everardo Pacific of sports medicine.  Call on Monday and make an appointment to be seen.  You may take ibuprofen or Tylenol in the interim.  You may also ice your ankle.  Please read the attached guide concerning RICE therapy.

## 2023-10-23 ENCOUNTER — Ambulatory Visit: Payer: PRIVATE HEALTH INSURANCE

## 2023-10-29 ENCOUNTER — Ambulatory Visit (INDEPENDENT_AMBULATORY_CARE_PROVIDER_SITE_OTHER): Payer: PRIVATE HEALTH INSURANCE

## 2023-10-29 DIAGNOSIS — I442 Atrioventricular block, complete: Secondary | ICD-10-CM

## 2023-11-01 LAB — CUP PACEART REMOTE DEVICE CHECK
Battery Impedance: 1708 Ohm
Battery Remaining Longevity: 38 mo
Battery Voltage: 2.75 V
Brady Statistic AP VP Percent: 10 %
Brady Statistic AP VS Percent: 0 %
Brady Statistic AS VP Percent: 90 %
Brady Statistic AS VS Percent: 0 %
Date Time Interrogation Session: 20241121224830
Implantable Lead Connection Status: 753985
Implantable Lead Connection Status: 753985
Implantable Lead Implant Date: 20141110
Implantable Lead Implant Date: 20141110
Implantable Lead Location: 753859
Implantable Lead Location: 753860
Implantable Lead Model: 5076
Implantable Lead Model: 5076
Implantable Pulse Generator Implant Date: 20141110
Lead Channel Impedance Value: 384 Ohm
Lead Channel Impedance Value: 567 Ohm
Lead Channel Pacing Threshold Amplitude: 0.5 V
Lead Channel Pacing Threshold Amplitude: 0.625 V
Lead Channel Pacing Threshold Pulse Width: 0.4 ms
Lead Channel Pacing Threshold Pulse Width: 0.4 ms
Lead Channel Setting Pacing Amplitude: 2 V
Lead Channel Setting Pacing Amplitude: 2.5 V
Lead Channel Setting Pacing Pulse Width: 0.4 ms
Lead Channel Setting Sensing Sensitivity: 2.8 mV
Zone Setting Status: 755011
Zone Setting Status: 755011

## 2023-11-25 NOTE — Addendum Note (Signed)
Addended by: Geralyn Flash D on: 11/25/2023 01:35 PM   Modules accepted: Orders

## 2023-11-25 NOTE — Progress Notes (Signed)
Remote pacemaker transmission.   

## 2024-01-22 ENCOUNTER — Ambulatory Visit: Payer: PRIVATE HEALTH INSURANCE

## 2024-01-28 ENCOUNTER — Ambulatory Visit (INDEPENDENT_AMBULATORY_CARE_PROVIDER_SITE_OTHER): Payer: PRIVATE HEALTH INSURANCE

## 2024-01-28 DIAGNOSIS — I442 Atrioventricular block, complete: Secondary | ICD-10-CM

## 2024-01-30 LAB — CUP PACEART REMOTE DEVICE CHECK
Battery Impedance: 1827 Ohm
Battery Remaining Longevity: 36 mo
Battery Voltage: 2.75 V
Brady Statistic AP VP Percent: 10 %
Brady Statistic AP VS Percent: 0 %
Brady Statistic AS VP Percent: 90 %
Brady Statistic AS VS Percent: 0 %
Date Time Interrogation Session: 20250219221320
Implantable Lead Connection Status: 753985
Implantable Lead Connection Status: 753985
Implantable Lead Implant Date: 20141110
Implantable Lead Implant Date: 20141110
Implantable Lead Location: 753859
Implantable Lead Location: 753860
Implantable Lead Model: 5076
Implantable Lead Model: 5076
Implantable Pulse Generator Implant Date: 20141110
Lead Channel Impedance Value: 399 Ohm
Lead Channel Impedance Value: 592 Ohm
Lead Channel Pacing Threshold Amplitude: 0.625 V
Lead Channel Pacing Threshold Amplitude: 0.625 V
Lead Channel Pacing Threshold Pulse Width: 0.4 ms
Lead Channel Pacing Threshold Pulse Width: 0.4 ms
Lead Channel Setting Pacing Amplitude: 2 V
Lead Channel Setting Pacing Amplitude: 2.5 V
Lead Channel Setting Pacing Pulse Width: 0.4 ms
Lead Channel Setting Sensing Sensitivity: 2.8 mV
Zone Setting Status: 755011
Zone Setting Status: 755011

## 2024-03-05 NOTE — Progress Notes (Signed)
 Remote pacemaker transmission.

## 2024-03-05 NOTE — Addendum Note (Signed)
 Addended by: Elease Etienne A on: 03/05/2024 02:06 PM   Modules accepted: Orders

## 2024-04-22 ENCOUNTER — Ambulatory Visit: Payer: PRIVATE HEALTH INSURANCE

## 2024-04-28 ENCOUNTER — Ambulatory Visit: Payer: PRIVATE HEALTH INSURANCE

## 2024-05-20 ENCOUNTER — Ambulatory Visit: Payer: PRIVATE HEALTH INSURANCE

## 2024-05-20 DIAGNOSIS — I442 Atrioventricular block, complete: Secondary | ICD-10-CM

## 2024-05-21 LAB — CUP PACEART REMOTE DEVICE CHECK
Battery Impedance: 2009 Ohm
Battery Remaining Longevity: 33 mo
Battery Voltage: 2.75 V
Brady Statistic AP VP Percent: 10 %
Brady Statistic AP VS Percent: 0 %
Brady Statistic AS VP Percent: 89 %
Brady Statistic AS VS Percent: 0 %
Date Time Interrogation Session: 20250610113125
Implantable Lead Connection Status: 753985
Implantable Lead Connection Status: 753985
Implantable Lead Implant Date: 20141110
Implantable Lead Implant Date: 20141110
Implantable Lead Location: 753859
Implantable Lead Location: 753860
Implantable Lead Model: 5076
Implantable Lead Model: 5076
Implantable Pulse Generator Implant Date: 20141110
Lead Channel Impedance Value: 394 Ohm
Lead Channel Impedance Value: 572 Ohm
Lead Channel Pacing Threshold Amplitude: 0.625 V
Lead Channel Pacing Threshold Amplitude: 0.875 V
Lead Channel Pacing Threshold Pulse Width: 0.4 ms
Lead Channel Pacing Threshold Pulse Width: 0.4 ms
Lead Channel Setting Pacing Amplitude: 2 V
Lead Channel Setting Pacing Amplitude: 2.5 V
Lead Channel Setting Pacing Pulse Width: 0.4 ms
Lead Channel Setting Sensing Sensitivity: 2.8 mV
Zone Setting Status: 755011
Zone Setting Status: 755011

## 2024-05-24 ENCOUNTER — Ambulatory Visit: Payer: Self-pay | Admitting: Internal Medicine

## 2024-07-15 ENCOUNTER — Encounter: Payer: Self-pay | Admitting: Internal Medicine

## 2024-07-15 ENCOUNTER — Ambulatory Visit: Payer: Self-pay | Attending: Cardiology | Admitting: Internal Medicine

## 2024-07-15 VITALS — BP 154/80 | HR 76 | Ht 68.0 in | Wt 220.0 lb

## 2024-07-15 DIAGNOSIS — I442 Atrioventricular block, complete: Secondary | ICD-10-CM

## 2024-07-15 LAB — CUP PACEART INCLINIC DEVICE CHECK
Battery Impedance: 1974 Ohm
Battery Remaining Longevity: 33 mo
Battery Voltage: 2.75 V
Brady Statistic AP VP Percent: 11 %
Brady Statistic AP VS Percent: 0 %
Brady Statistic AS VP Percent: 89 %
Brady Statistic AS VS Percent: 0 %
Date Time Interrogation Session: 20250806151013
Implantable Lead Connection Status: 753985
Implantable Lead Connection Status: 753985
Implantable Lead Implant Date: 20141110
Implantable Lead Implant Date: 20141110
Implantable Lead Location: 753859
Implantable Lead Location: 753860
Implantable Lead Model: 5076
Implantable Lead Model: 5076
Implantable Pulse Generator Implant Date: 20141110
Lead Channel Impedance Value: 405 Ohm
Lead Channel Impedance Value: 591 Ohm
Lead Channel Pacing Threshold Amplitude: 0.625 V
Lead Channel Pacing Threshold Amplitude: 0.75 V
Lead Channel Pacing Threshold Pulse Width: 0.4 ms
Lead Channel Pacing Threshold Pulse Width: 0.4 ms
Lead Channel Sensing Intrinsic Amplitude: 4 mV
Lead Channel Setting Pacing Amplitude: 2 V
Lead Channel Setting Pacing Amplitude: 2.5 V
Lead Channel Setting Pacing Pulse Width: 0.4 ms
Lead Channel Setting Sensing Sensitivity: 2.8 mV
Zone Setting Status: 755011
Zone Setting Status: 755011

## 2024-07-15 NOTE — Patient Instructions (Signed)
 Medication Instructions:  Your physician recommends that you continue on your current medications as directed. Please refer to the Current Medication list given to you today.  *If you need a refill on your cardiac medications before your next appointment, please call your pharmacy*  Lab Work: None ordered.  You may go to any [facility] Location for your lab work:  [facility] - [address] ([facility]) - [address] [phone] [address]  [place] - [address]   [place]  - [address]   [place] - [address] - [address] [facility]  - [address] [place] - [address] ([facility]   If you have labs (blood work) drawn today and your tests are completely normal, you will receive your results only by: [employer] (if you have MyChart)  If you have any lab test that is abnormal or we need to change your treatment, we will call you or send a MyChart message to review the results.  Testing/Procedures: None ordered.  Follow-Up: At [facility], you and your health needs are our priority.  As part of our continuing mission to provide you with exceptional heart care, we have created designated Provider Care Teams.  These Care Teams include your primary Cardiologist (physician) and Advanced Practice Providers (APPs -  Physician Assistants and Nurse Practitioners) who all work together to provide you with the care you need, when you need it.  Your next appointment:   1 year(s)  The format for your next appointment:   In Person  Provider:   Donnice Primus, MD or one of the following Advanced Practice Providers on your designated Care Team:   Charlies Arthur, [place] Ozell Jodie Passey, [place] Leotis Barrack, NP  Note: Remote monitoring is used to monitor your Pacemaker/ ICD from home. This monitoring reduces the number of office visits required to check  your device to one time per year. It allows us  to keep an eye on the functioning of your device to ensure it is working properly.

## 2024-07-15 NOTE — Progress Notes (Signed)
 HPI [name] returns today for followup. He has a h/o CHB, s/p PPM insertion, HTN, and has done well in the interim. He denies chest pain or sob. No syncope No edema. He notes that he does get a little more tired with strenuous activity. I have not seen him in almost 4 years. He remains active.  No CHF symptoms. No Known Allergies   Current Outpatient Medications  Medication Sig Dispense Refill   amoxicillin -clavulanate (AUGMENTIN ) 875-125 MG tablet Take 1 tablet by mouth 2 (two) times daily. 14 tablet 0   carbamide peroxide (DEBROX) 6.5 % OTIC solution Place 5 drops into both ears 2 (two) times daily. 15 mL 0   cetirizine  (ZYRTEC  ALLERGY) 10 MG tablet Take 1 tablet (10 mg total) by mouth daily. 30 tablet 0   ibuprofen (ADVIL,MOTRIN) 200 MG tablet Take 600 mg by mouth every 6 (six) hours as needed for moderate pain.     pseudoephedrine  (SUDAFED) 30 MG tablet Take 1 tablet (30 mg total) by mouth every 8 (eight) hours as needed for congestion. 30 tablet 0   rosuvastatin  (CRESTOR ) 5 MG tablet Take 1 tablet (5 mg total) by mouth daily with supper. 90 tablet 1   No current facility-administered medications for this visit.     Past Medical History:  Diagnosis Date   Complete heart block (HCC)    Congenital heart block    H/O congenital heart block    High cholesterol    Mobitz type 2 second degree AV block    Syncope     ROS:   All systems reviewed and negative except as noted in the HPI.   Past Surgical History:  Procedure Laterality Date   PERMANENT PACEMAKER INSERTION N/A 10/19/2013   Procedure: PERMANENT PACEMAKER INSERTION;  Surgeon: Danelle LELON [place], MD;  Location: [facility] CATH LAB;  Service: Cardiovascular;  Laterality: N/A;     Family History  Problem Relation Age of Onset   Diabetes Mother    Hypertension Mother    Healthy Sister    Healthy Brother    Healthy Brother    Healthy Brother    Healthy Daughter    Healthy Son    Healthy Daughter    Premature  birth Daughter    Healthy Son    Healthy Son    Healthy Son      Social History   Socioeconomic History   Marital status: Married    Spouse name: Not on file   Number of children: Not on file   Years of education: Not on file   Highest education level: Not on file  Occupational History   Not on file  Tobacco Use   Smoking status: Former    Current packs/day: 0.00    Types: Cigarettes    Quit date: 12/10/2013    Years since quitting: 10.6   Smokeless tobacco: Never   Tobacco comments:    VERY LIGHT, OCCASIONAL SMOKER  Vaping Use   Vaping status: Never Used  Substance and Sexual Activity   Alcohol use: Never    Comment: occassionally   Drug use: No   Sexual activity: Yes    Birth control/protection: None  Other Topics Concern   Not on file  Social History Narrative   Not on file   Social Drivers of Health   Financial Resource Strain: Not on file  Food Insecurity: Not on file  Transportation Needs: Not on file  Physical Activity: Not on file  Stress: Not on  file  Social Connections: Not on file  Intimate Partner Violence: Not on file     BP (!) 154/80   Pulse 76   Ht 5' 8 (1.727 m)   Wt 220 lb (99.8 kg)   SpO2 96%   BMI 33.45 kg/m   Physical Exam:  Well appearing NAD HEENT: Unremarkable Neck:  No JVD, no thyromegally Lymphatics:  No adenopathy Back:  No CVA tenderness Lungs:  Clear with no wheezes HEART:  Regular rate rhythm, no murmurs, no rubs, no clicks Abd:  soft, positive bowel sounds, no organomegally, no rebound, no guarding Ext:  2 plus pulses, no edema, no cyanosis, no clubbing Skin:  No rashes no nodules Neuro:  CN II through XII intact, motor grossly intact  EKG - P sync ventricular pacing  DEVICE  Normal device function.  See PaceArt for details.   Assess/Plan:  1.CHB - He is asymptomatic,s/p PPM insertion. He has no escape.  2. 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 He has 4 years of battery longevity. 3. HTN - his bp  is now well controlled and he has stopped taking norvasc .    Danelle Waddell COME

## 2024-07-16 NOTE — Addendum Note (Signed)
 Addended by: VICCI SELLER A on: 07/16/2024 02:52 PM   Modules accepted: Orders

## 2024-07-16 NOTE — Progress Notes (Signed)
 Remote pacemaker transmission.

## 2024-07-22 ENCOUNTER — Ambulatory Visit: Payer: PRIVATE HEALTH INSURANCE

## 2024-07-28 ENCOUNTER — Ambulatory Visit: Payer: PRIVATE HEALTH INSURANCE

## 2024-08-19 ENCOUNTER — Ambulatory Visit: Payer: PRIVATE HEALTH INSURANCE

## 2024-08-24 ENCOUNTER — Ambulatory Visit (INDEPENDENT_AMBULATORY_CARE_PROVIDER_SITE_OTHER): Payer: PRIVATE HEALTH INSURANCE

## 2024-08-24 DIAGNOSIS — I442 Atrioventricular block, complete: Secondary | ICD-10-CM

## 2024-08-26 LAB — CUP PACEART REMOTE DEVICE CHECK
Battery Impedance: 2080 Ohm
Battery Remaining Longevity: 31 mo
Battery Voltage: 2.74 V
Brady Statistic AP VP Percent: 21 %
Brady Statistic AP VS Percent: 0 %
Brady Statistic AS VP Percent: 79 %
Brady Statistic AS VS Percent: 0 %
Date Time Interrogation Session: 20250915162023
Implantable Lead Connection Status: 753985
Implantable Lead Connection Status: 753985
Implantable Lead Implant Date: 20141110
Implantable Lead Implant Date: 20141110
Implantable Lead Location: 753859
Implantable Lead Location: 753860
Implantable Lead Model: 5076
Implantable Lead Model: 5076
Implantable Pulse Generator Implant Date: 20141110
Lead Channel Impedance Value: 399 Ohm
Lead Channel Impedance Value: 568 Ohm
Lead Channel Pacing Threshold Amplitude: 0.5 V
Lead Channel Pacing Threshold Amplitude: 0.875 V
Lead Channel Pacing Threshold Pulse Width: 0.4 ms
Lead Channel Pacing Threshold Pulse Width: 0.4 ms
Lead Channel Setting Pacing Amplitude: 2 V
Lead Channel Setting Pacing Amplitude: 2.5 V
Lead Channel Setting Pacing Pulse Width: 0.4 ms
Lead Channel Setting Sensing Sensitivity: 2.8 mV
Zone Setting Status: 755011
Zone Setting Status: 755011

## 2024-08-28 ENCOUNTER — Ambulatory Visit: Payer: Self-pay | Admitting: Internal Medicine

## 2024-08-31 NOTE — Progress Notes (Signed)
 Remote PPM Transmission

## 2024-10-21 ENCOUNTER — Ambulatory Visit: Payer: PRIVATE HEALTH INSURANCE

## 2024-10-27 ENCOUNTER — Ambulatory Visit: Payer: PRIVATE HEALTH INSURANCE

## 2024-11-18 ENCOUNTER — Ambulatory Visit: Payer: PRIVATE HEALTH INSURANCE

## 2024-11-23 ENCOUNTER — Ambulatory Visit: Payer: PRIVATE HEALTH INSURANCE

## 2024-11-23 DIAGNOSIS — I442 Atrioventricular block, complete: Secondary | ICD-10-CM

## 2024-11-25 LAB — CUP PACEART REMOTE DEVICE CHECK
Battery Impedance: 2166 Ohm
Battery Remaining Longevity: 29 mo
Battery Voltage: 2.74 V
Brady Statistic AP VP Percent: 17 %
Brady Statistic AP VS Percent: 0 %
Brady Statistic AS VP Percent: 83 %
Brady Statistic AS VS Percent: 0 %
Date Time Interrogation Session: 20251216225332
Implantable Lead Connection Status: 753985
Implantable Lead Connection Status: 753985
Implantable Lead Implant Date: 20141110
Implantable Lead Implant Date: 20141110
Implantable Lead Location: 753859
Implantable Lead Location: 753860
Implantable Lead Model: 5076
Implantable Lead Model: 5076
Implantable Pulse Generator Implant Date: 20141110
Lead Channel Impedance Value: 398 Ohm
Lead Channel Impedance Value: 586 Ohm
Lead Channel Pacing Threshold Amplitude: 0.625 V
Lead Channel Pacing Threshold Amplitude: 0.625 V
Lead Channel Pacing Threshold Pulse Width: 0.4 ms
Lead Channel Pacing Threshold Pulse Width: 0.4 ms
Lead Channel Setting Pacing Amplitude: 2 V
Lead Channel Setting Pacing Amplitude: 2.5 V
Lead Channel Setting Pacing Pulse Width: 0.46 ms
Lead Channel Setting Sensing Sensitivity: 2.8 mV
Zone Setting Status: 755011
Zone Setting Status: 755011

## 2024-11-27 NOTE — Progress Notes (Signed)
 Remote PPM Transmission

## 2024-12-06 ENCOUNTER — Ambulatory Visit: Payer: Self-pay | Admitting: Internal Medicine

## 2025-01-26 ENCOUNTER — Ambulatory Visit: Payer: PRIVATE HEALTH INSURANCE

## 2025-02-17 ENCOUNTER — Ambulatory Visit: Payer: PRIVATE HEALTH INSURANCE

## 2025-02-22 ENCOUNTER — Ambulatory Visit: Payer: PRIVATE HEALTH INSURANCE

## 2025-04-27 ENCOUNTER — Ambulatory Visit: Payer: PRIVATE HEALTH INSURANCE

## 2025-05-19 ENCOUNTER — Ambulatory Visit: Payer: PRIVATE HEALTH INSURANCE

## 2025-05-24 ENCOUNTER — Ambulatory Visit: Payer: PRIVATE HEALTH INSURANCE

## 2025-07-27 ENCOUNTER — Ambulatory Visit: Payer: PRIVATE HEALTH INSURANCE

## 2025-08-18 ENCOUNTER — Ambulatory Visit: Payer: PRIVATE HEALTH INSURANCE
